# Patient Record
Sex: Male | Born: 1955 | Race: White | Hispanic: No | Marital: Married | Smoking: Former smoker
Health system: Southern US, Community
[De-identification: ages and names within clinical notes are randomized; demographics above are authoritative.]

## PROBLEM LIST (undated history)

## (undated) DIAGNOSIS — E119 Type 2 diabetes mellitus without complications: Secondary | ICD-10-CM

## (undated) DIAGNOSIS — N200 Calculus of kidney: Secondary | ICD-10-CM

## (undated) DIAGNOSIS — Z87442 Personal history of urinary calculi: Secondary | ICD-10-CM

## (undated) HISTORY — PX: LITHOTRIPSY: SUR834

---

## 2006-05-25 ENCOUNTER — Ambulatory Visit: Payer: Self-pay | Admitting: Emergency Medicine

## 2006-11-30 ENCOUNTER — Ambulatory Visit: Payer: Self-pay | Admitting: Internal Medicine

## 2007-07-11 ENCOUNTER — Ambulatory Visit: Payer: Self-pay | Admitting: Internal Medicine

## 2007-08-13 ENCOUNTER — Emergency Department: Payer: Self-pay | Admitting: Emergency Medicine

## 2008-01-04 ENCOUNTER — Ambulatory Visit: Payer: Self-pay | Admitting: Gastroenterology

## 2008-05-23 ENCOUNTER — Ambulatory Visit: Payer: Self-pay | Admitting: Internal Medicine

## 2008-05-23 ENCOUNTER — Emergency Department: Payer: Self-pay | Admitting: Emergency Medicine

## 2009-08-03 ENCOUNTER — Ambulatory Visit: Payer: Self-pay | Admitting: Family Medicine

## 2009-11-22 ENCOUNTER — Ambulatory Visit: Payer: Self-pay | Admitting: Internal Medicine

## 2011-01-27 ENCOUNTER — Emergency Department: Payer: Self-pay | Admitting: *Deleted

## 2011-01-27 LAB — RAPID INFLUENZA A&B ANTIGENS

## 2011-02-02 ENCOUNTER — Observation Stay: Payer: Self-pay | Admitting: Internal Medicine

## 2011-02-02 LAB — COMPREHENSIVE METABOLIC PANEL
Alkaline Phosphatase: 63 U/L (ref 50–136)
Anion Gap: 7 (ref 7–16)
BUN: 9 mg/dL (ref 7–18)
Bilirubin,Total: 0.5 mg/dL (ref 0.2–1.0)
Chloride: 106 mmol/L (ref 98–107)
Co2: 28 mmol/L (ref 21–32)
Creatinine: 0.83 mg/dL (ref 0.60–1.30)
EGFR (African American): >60
EGFR (Non-African Amer.): >60
Osmolality: 282 (ref 275–301)
Potassium: 4 mmol/L (ref 3.5–5.1)
SGPT (ALT): 41 U/L
Sodium: 141 mmol/L (ref 136–145)
Total Protein: 7.2 g/dL (ref 6.4–8.2)

## 2011-02-02 LAB — APTT: Activated PTT: 26.8 secs (ref 23.6–35.9)

## 2011-02-02 LAB — LIPID PANEL
Cholesterol: 169 mg/dL (ref 0–200)
Ldl Cholesterol, Calc: 67 mg/dL (ref 0–100)
Triglycerides: 343 mg/dL — ABNORMAL HIGH (ref 0–200)

## 2011-02-02 LAB — CBC
HCT: 45.4 % (ref 40.0–52.0)
MCH: 32 pg (ref 26.0–34.0)
MCHC: 34.2 g/dL (ref 32.0–36.0)
MCV: 94 fL (ref 80–100)
WBC: 7.8 10*3/uL (ref 3.8–10.6)

## 2011-02-02 LAB — TROPONIN I
Troponin-I: <0.02 ng/mL
Troponin-I: <0.02 ng/mL

## 2011-02-02 LAB — PROTIME-INR: Prothrombin Time: 12.4 secs (ref 11.5–14.7)

## 2011-02-03 LAB — CBC WITH DIFFERENTIAL/PLATELET
Basophil #: 0.1 10*3/uL (ref 0.0–0.1)
Eosinophil #: 0.2 10*3/uL (ref 0.0–0.7)
Eosinophil %: 3.5 %
HCT: 40.7 % (ref 40.0–52.0)
Lymphocyte #: 1.7 10*3/uL (ref 1.0–3.6)
MCH: 32.4 pg (ref 26.0–34.0)
MCV: 93 fL (ref 80–100)
Monocyte #: 0.6 10*3/uL (ref 0.0–0.7)
Neutrophil #: 4 10*3/uL (ref 1.4–6.5)
Neutrophil %: 60.6 %
Platelet: 138 10*3/uL — ABNORMAL LOW (ref 150–440)
RDW: 13.2 % (ref 11.5–14.5)
WBC: 6.6 10*3/uL (ref 3.8–10.6)

## 2011-02-03 LAB — BASIC METABOLIC PANEL
Anion Gap: 12 (ref 7–16)
BUN: 8 mg/dL (ref 7–18)
Calcium, Total: 8.4 mg/dL — ABNORMAL LOW (ref 8.5–10.1)
Chloride: 106 mmol/L (ref 98–107)
Co2: 26 mmol/L (ref 21–32)
EGFR (Non-African Amer.): >60
Potassium: 3.9 mmol/L (ref 3.5–5.1)
Sodium: 144 mmol/L (ref 136–145)

## 2011-02-03 LAB — TROPONIN I: Troponin-I: <0.02 ng/mL

## 2011-02-03 LAB — LIPID PANEL: HDL Cholesterol: 26 mg/dL — ABNORMAL LOW (ref 40–60)

## 2011-03-07 ENCOUNTER — Ambulatory Visit: Payer: Self-pay | Admitting: Family Medicine

## 2011-03-10 ENCOUNTER — Ambulatory Visit: Payer: Self-pay | Admitting: Family Medicine

## 2012-05-26 ENCOUNTER — Emergency Department: Payer: Self-pay | Admitting: Unknown Physician Specialty

## 2012-05-26 LAB — CBC
HCT: 41.4 % (ref 40.0–52.0)
HGB: 14.2 g/dL (ref 13.0–18.0)
MCH: 31 pg (ref 26.0–34.0)
MCV: 90 fL (ref 80–100)
Platelet: 186 10*3/uL (ref 150–440)
RBC: 4.59 10*6/uL (ref 4.40–5.90)
RDW: 13.3 % (ref 11.5–14.5)
WBC: 11.8 10*3/uL — ABNORMAL HIGH (ref 3.8–10.6)

## 2012-05-26 LAB — URINALYSIS, COMPLETE
Bacteria: NONE SEEN
Bilirubin,UR: NEGATIVE
Leukocyte Esterase: NEGATIVE
Nitrite: NEGATIVE
Ph: 6 (ref 4.5–8.0)
Protein: NEGATIVE
RBC,UR: 10 /HPF (ref 0–5)
Squamous Epithelial: <1
WBC UR: 2 /HPF (ref 0–5)

## 2012-05-26 LAB — BASIC METABOLIC PANEL
Anion Gap: 8 (ref 7–16)
Chloride: 102 mmol/L (ref 98–107)
Co2: 25 mmol/L (ref 21–32)
Glucose: 101 mg/dL — ABNORMAL HIGH (ref 65–99)
Osmolality: 271 (ref 275–301)
Potassium: 3.6 mmol/L (ref 3.5–5.1)

## 2013-02-06 IMAGING — CR DG CHEST 2V
1 series · 2 of 2 positions shown · non-contrast
Comparison: none

REASON FOR EXAM: cough for 3 weeks
COMMENTS:   May transport without cardiac monitor

PROCEDURE:     DXR - DXR CHEST PA (OR AP) AND LATERAL  - January 27, 2011  [DATE]
RESULT:     Comparison is made to the prior exam of 11/22/2009. The lung
fields are clear. The heart, mediastinal and osseous structures show no
significant abnormalities.

[Series 1: w chest pa · 0.14mm/px · 2 of 2 slices shown]
[im 1/2]
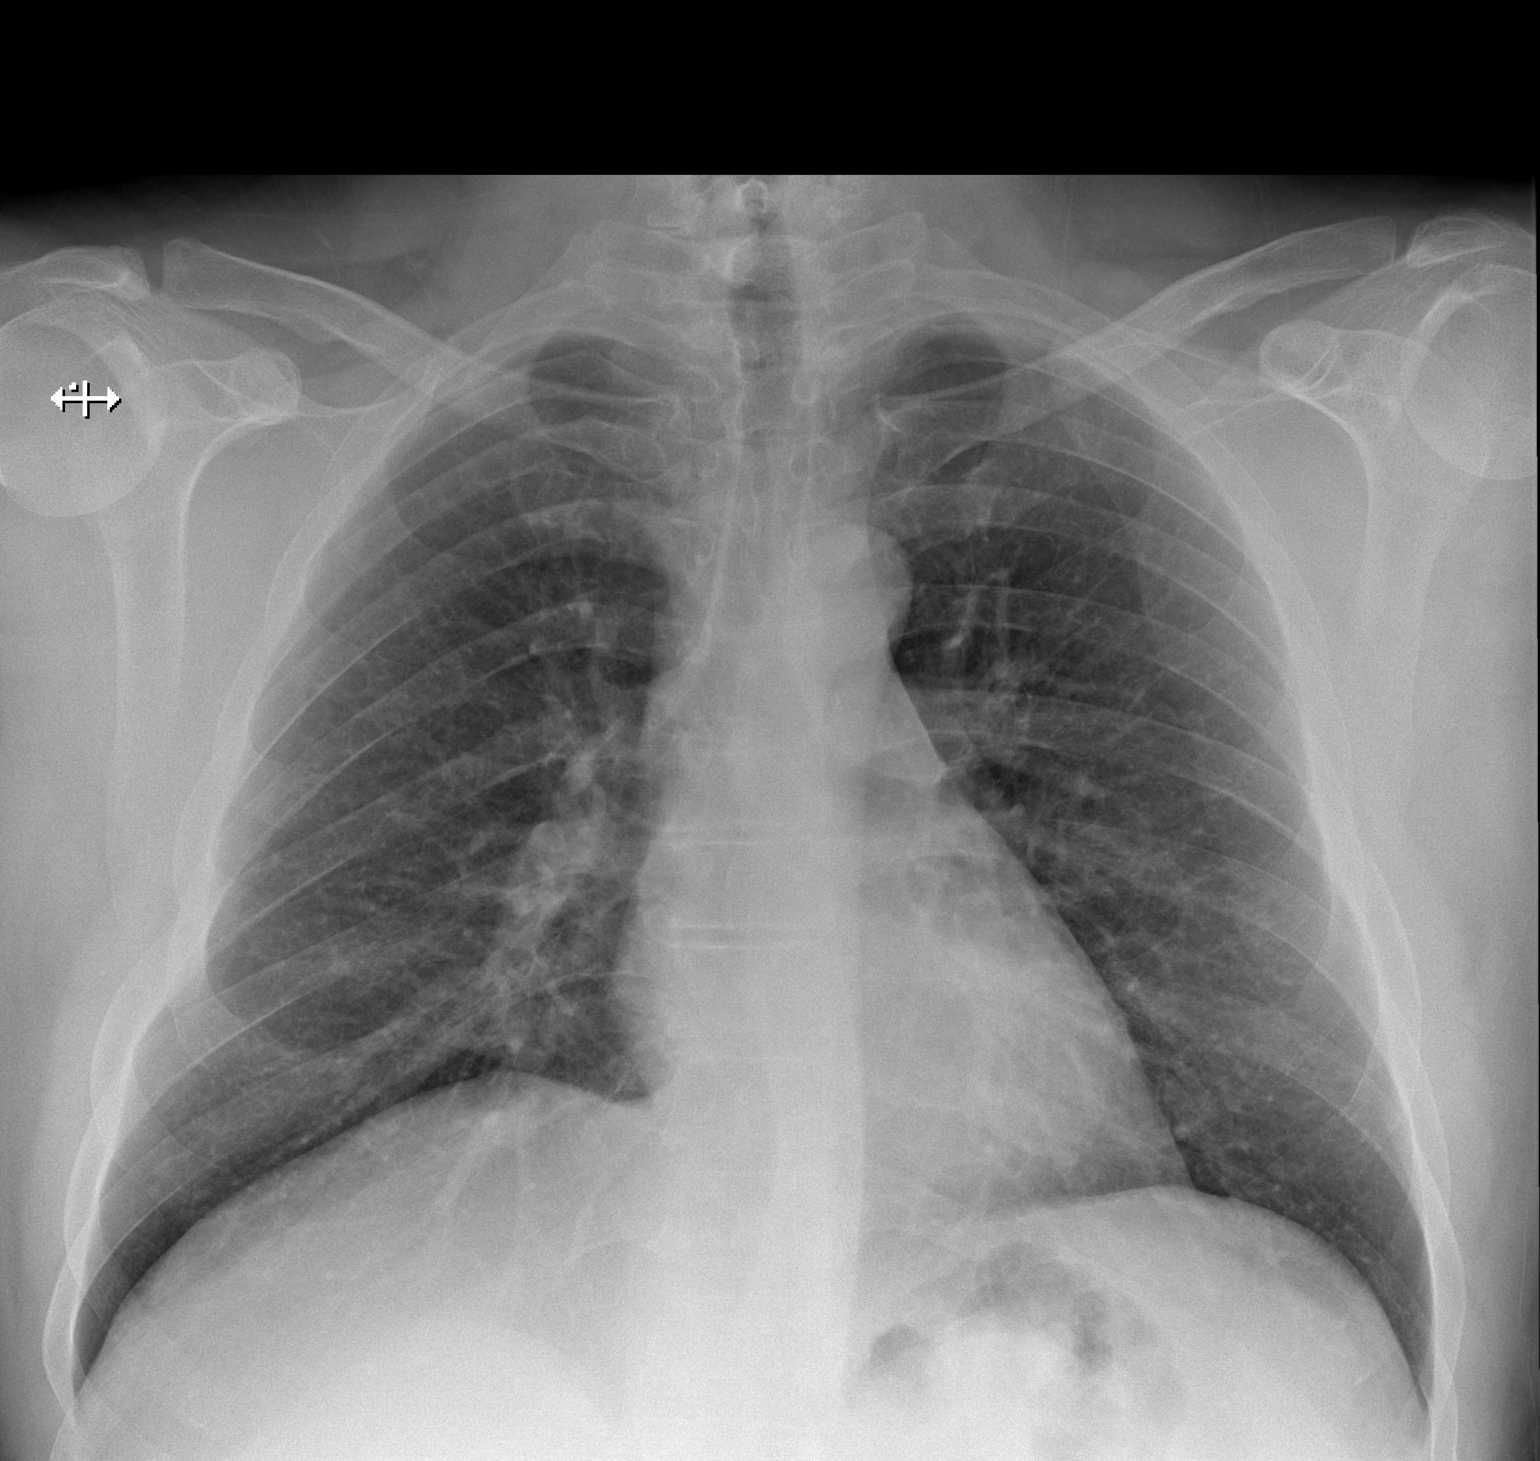
[im 2/2]
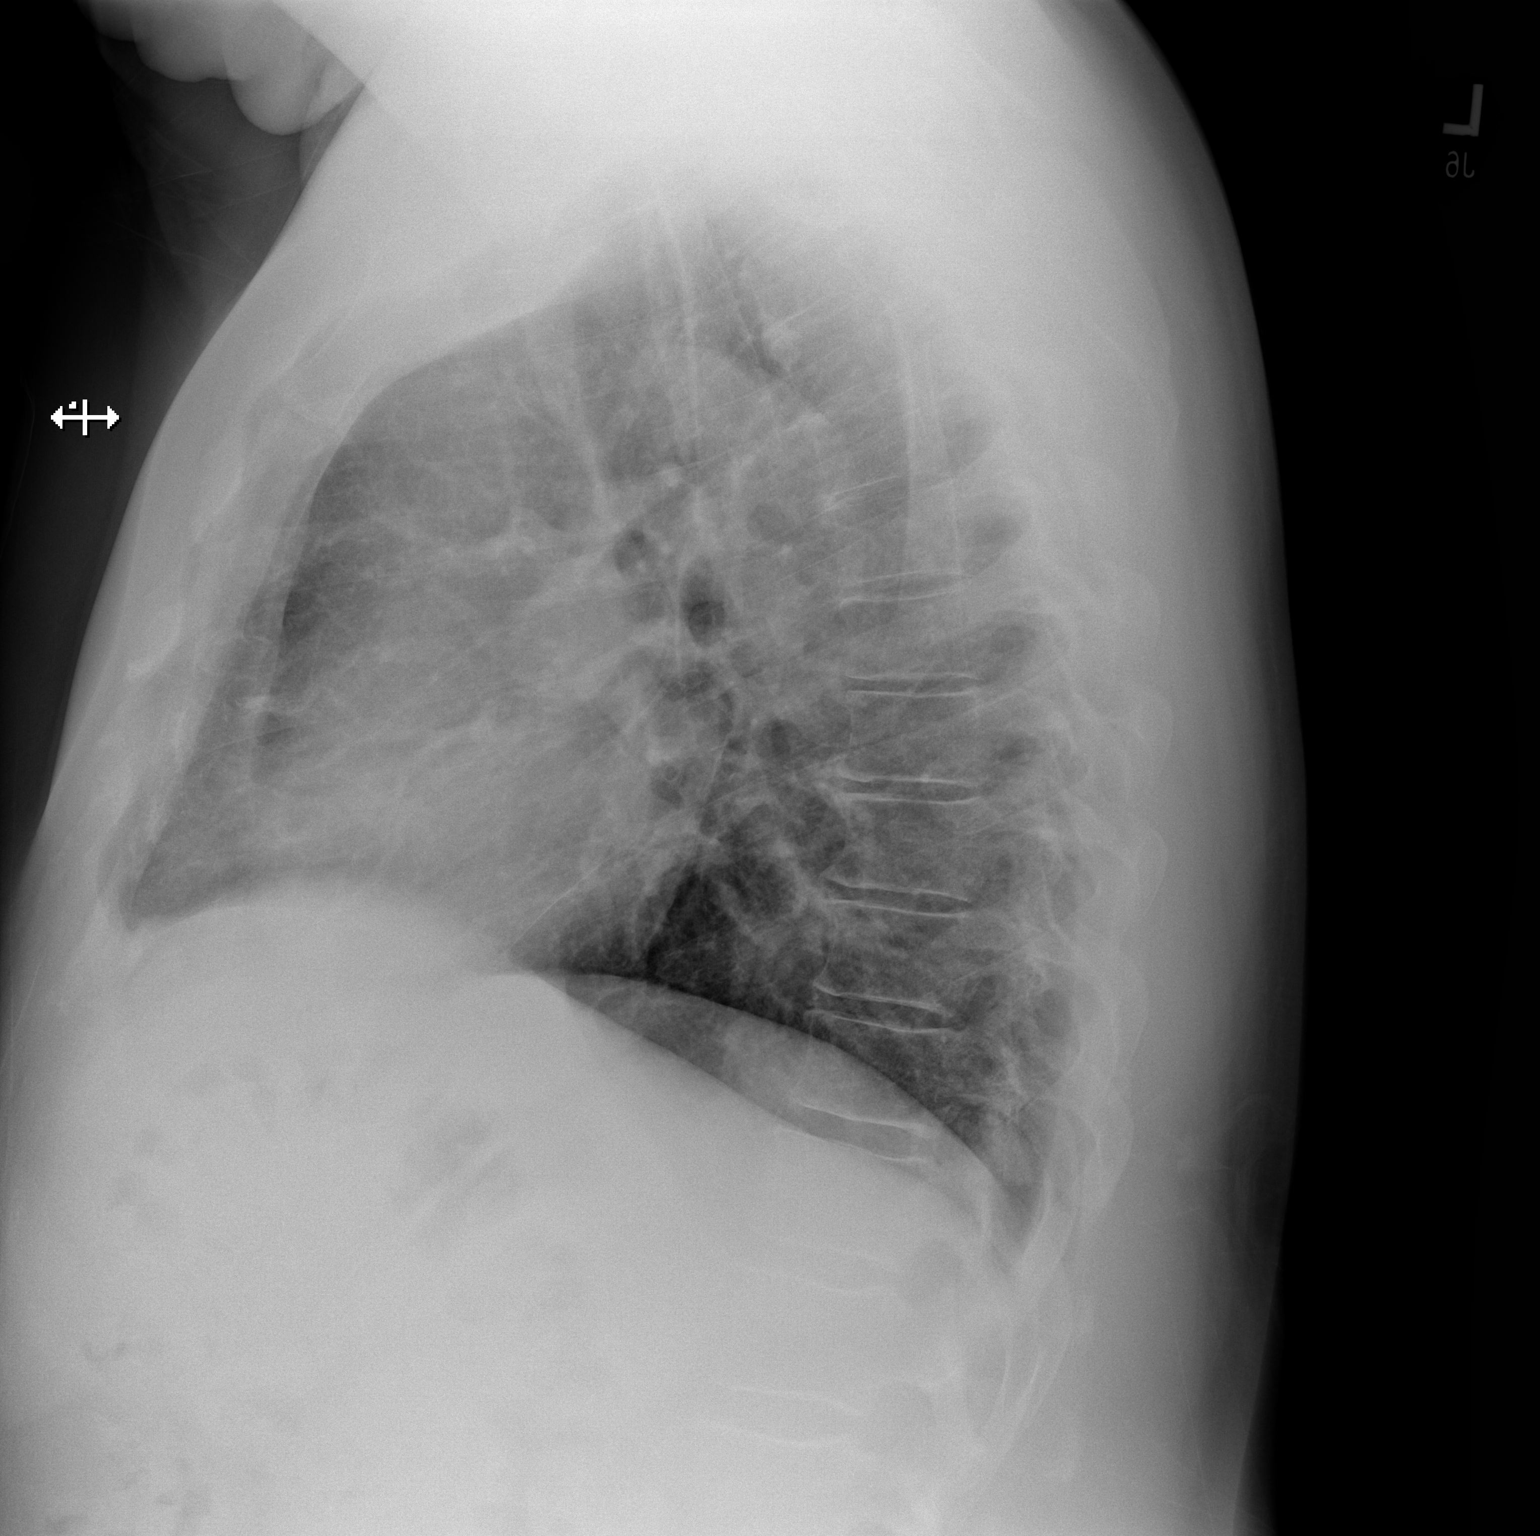

[2 of 2 positions shown; findings below may reference images not displayed]

IMPRESSION: No acute changes are identified.

## 2013-02-12 IMAGING — US ABDOMEN ULTRASOUND
1 series · 17 of 25 positions shown · non-contrast
Comparison: none

REASON FOR EXAM: r/o gall stone, shoulder blade pain, n/v/abd pain
COMMENTS:

PROCEDURE:     US  - US ABDOMEN GENERAL SURVEY  - February 02, 2011  [DATE]
RESULT:     Comparison: None
TECHNIQUE: Multiple gray-scale and color-flow Doppler images of the abdomen
are presented for review.

[Series 1: abdomen ultrasound · 17 of 93 slices shown]
[im 1/93]
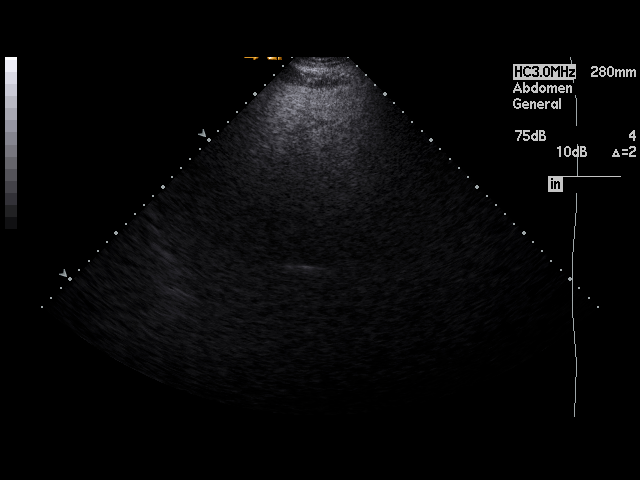
[im 8/93]
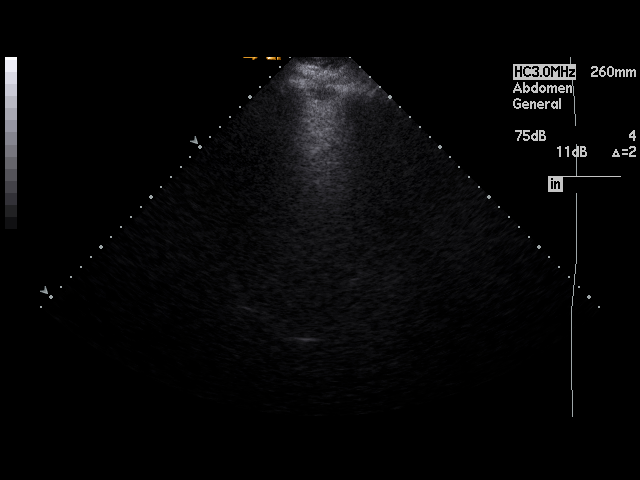
[im 12/93]
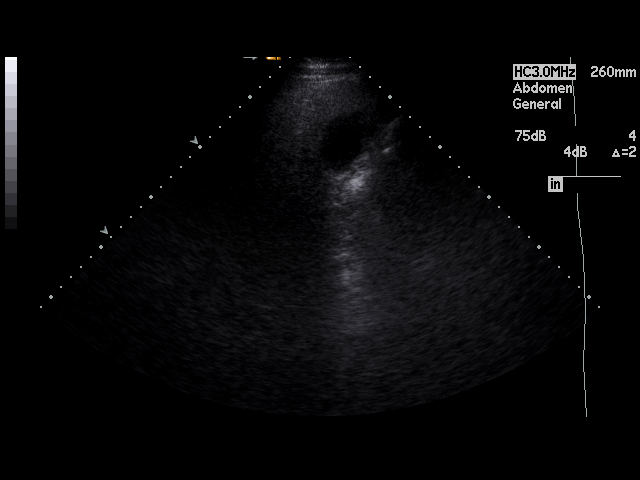
[im 20/93]
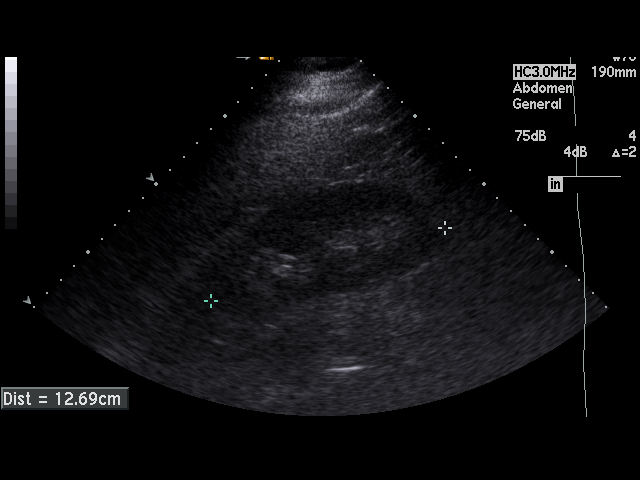
[im 24/93]
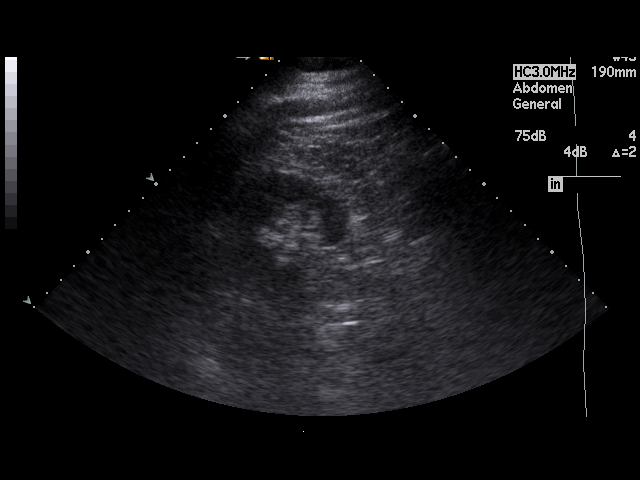
[im 31/93]
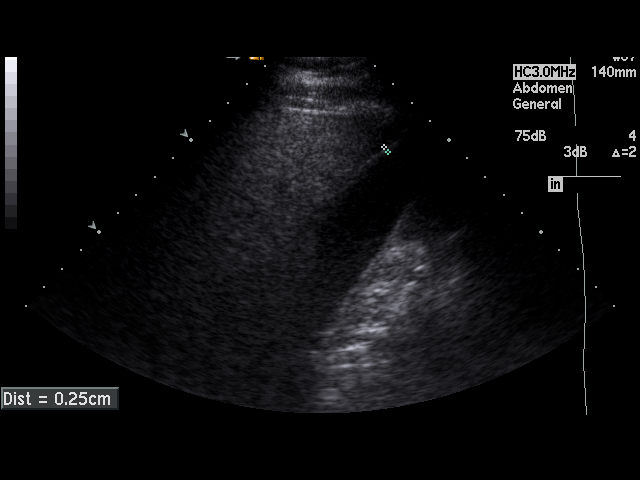
[im 35/93]
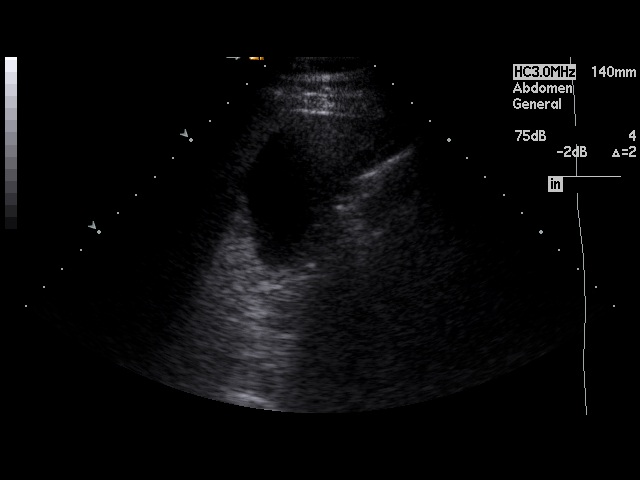
[im 43/93]
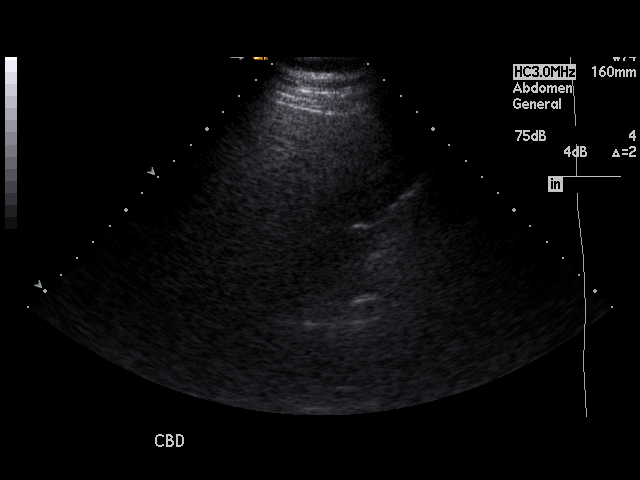
[im 47/93]
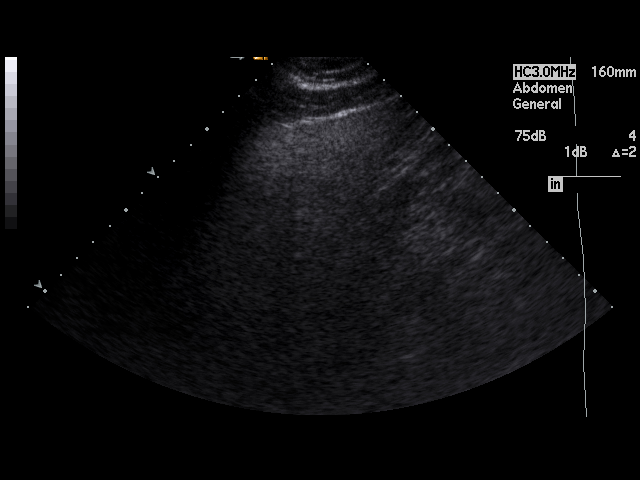
[im 50/93]
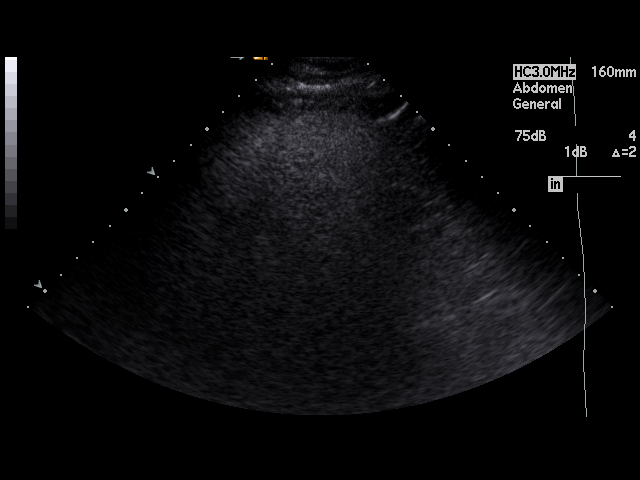
[im 58/93]
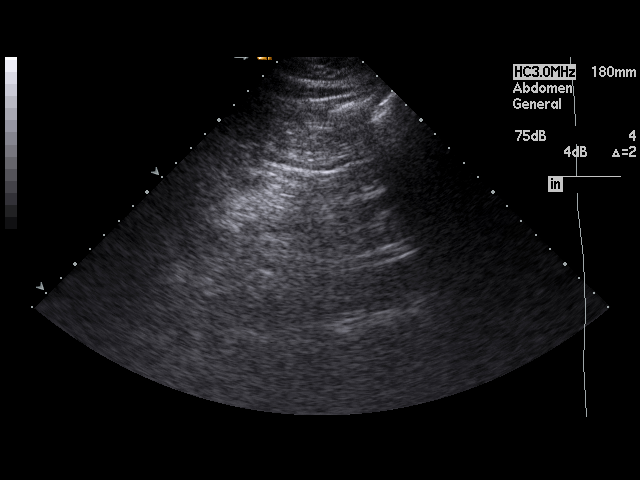
[im 62/93]
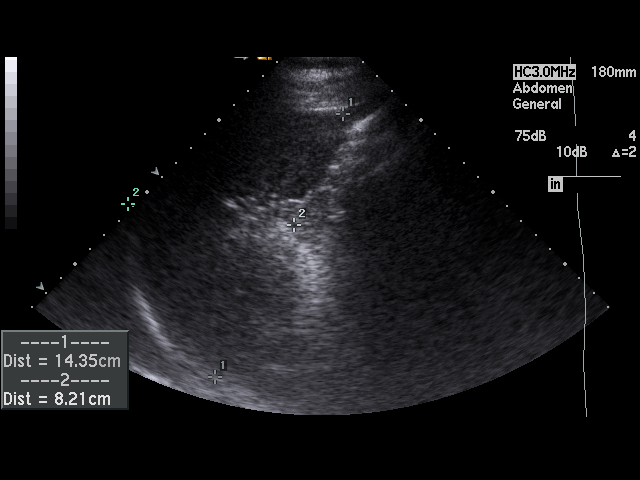
[im 70/93]
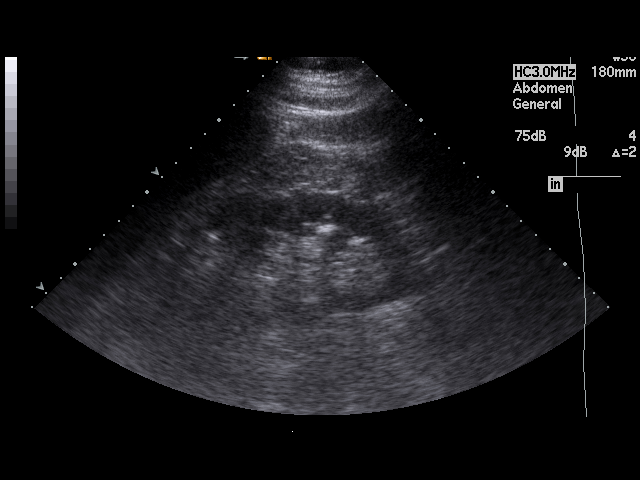
[im 73/93]
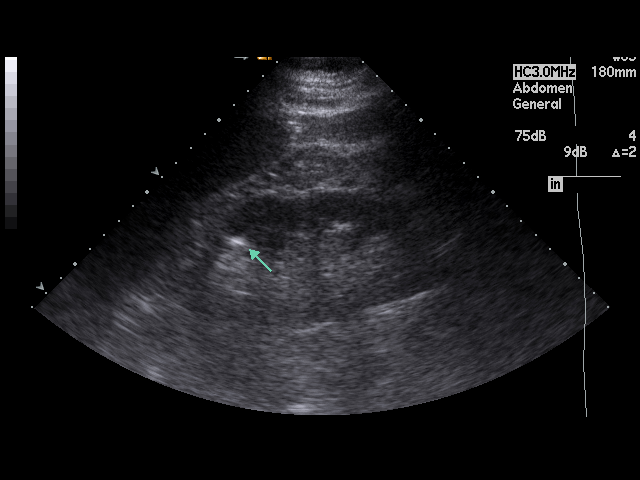
[im 81/93]
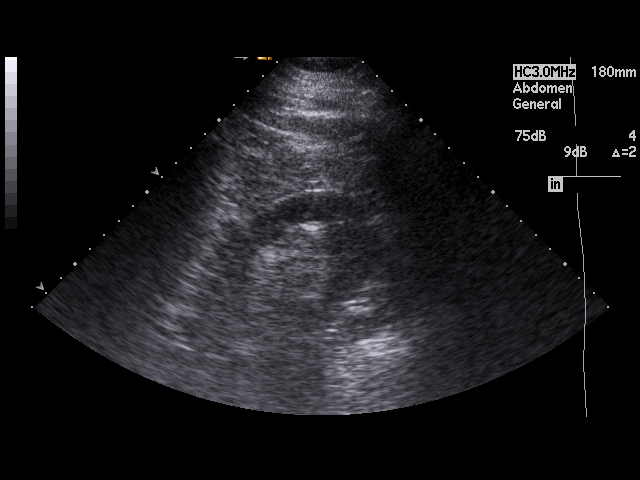
[im 85/93]
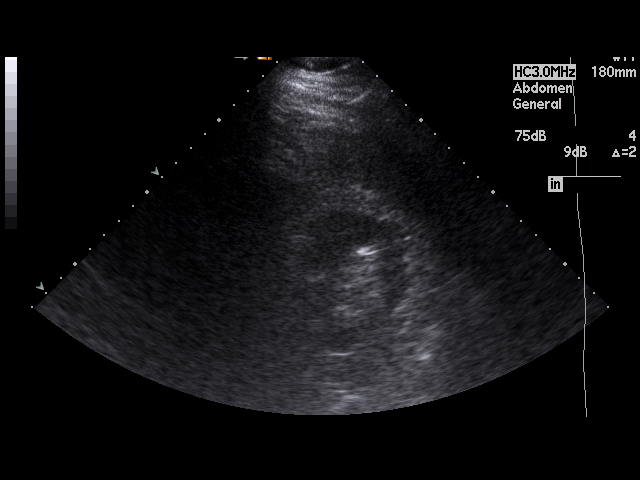
[im 93/93]
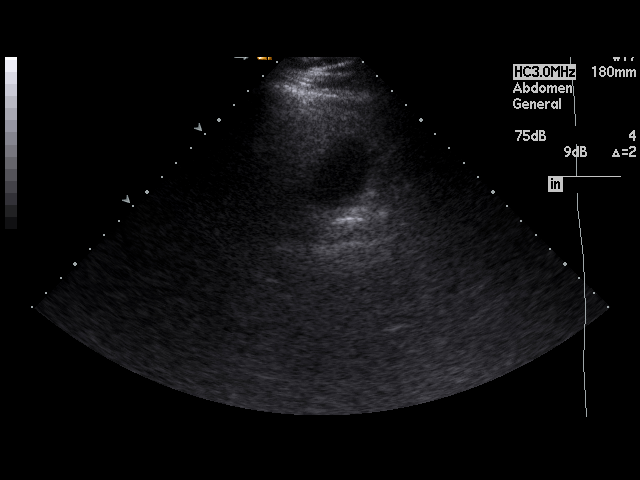

[17 of 25 positions shown; findings below may reference images not displayed]

FINDINGS: The liver is increased in echogenicity most consistent with hepatic
steatosis. There is no focal mass.

The gallbladder is suboptimally visualized. There are no definite
cholelithiasis. There is no intra- or extrahepatic biliary ductal
dilatation. The common duct measures 5.4 mm in maximal diameter. There is no
gallbladder wall thickening, pericholecystic fluid, or sonographic Murphy's
sign.

The visualized portion of the pancreas is normal in echogenicity. The spleen
is enlarged measuring 14.4 cm.. Bilateral kidneys are normal in echogenicity
and size. The right kidney measures 12.7 x 6.2 x 5.9 cm. The left kidney
measures 13.5 x 6.4 x 6.5 cm. There is no hydronephrosis. There is an 8mm
nonobstructing left renal calculus. The abdominal aorta  is unremarkable.
The IVC is suboptimally visualized.
IMPRESSION: 1. The gallbladder is suboptimally visualized secondary to an echogenic
liver making it technically difficult to visualize the gallbladder. There
are no definite cholelithiasis or sonographic evidence of acute
cholecystitis. If there is further clinical concern recommend a HIDA scan.
2. Splenomegaly.
3. Hepatic steatosis.
4. Nonobstructing left nephrolithiasis.

## 2013-10-31 ENCOUNTER — Ambulatory Visit: Payer: Self-pay | Admitting: Emergency Medicine

## 2014-05-13 NOTE — H&P (Signed)
PATIENT NAME:  Steven Clements, Steven Clements MR#:  [id number] DATE OF BIRTH:  12-Jun-1955  DATE OF ADMISSION:  02/02/2011  PRIMARY CARE PHYSICIAN:  Dione HousekeeperMario Ernesto Olmedo, MD at [facility]   REFERRING PHYSICIAN:  Daryel NovemberJonathan Williams, MD    CHIEF COMPLAINT: Chest pain.   HISTORY OF PRESENT ILLNESS: The patient is a 59 year old male with no significant medical problems, is being admitted for suspected unstable angina. The patient was recently treated about a week ago for acute bronchitis with antibiotics. He was negative for influenza. He was doing okay until this morning when he went to work and started feeling really diaphoretic. He could barely walk 350 feet and all the symptoms started, including left-sided chest pain radiating across his chest and going back between the shoulder blades. He was very diaphoretic, pale and vomited twice. He was feeling really dizzy and decided to come to the Emergency Department. While in the Emergency Department he was given nitroglycerin and aspirin, and his chest pain has resolved; but he is not feeling too well, and he is being admitted for further evaluation and management.   PAST MEDICAL HISTORY: None.   ALLERGIES: No known drug allergies.  SOCIAL HISTORY: He smokes two cigarettes daily for the last 25 years. His wife is an active smoker, and he has been exposed to passive smoking quite more. He quit on-and-off about a total of 11 years. He denies any alcohol. He has worked at [employer] for the last eight months, and it has been a very stressful job as he recently had a new Associate Professorunit manager.   FAMILY HISTORY: His father had myocardial infarction in 5370s, was also diagnosed with cancer.   MEDICATIONS AT HOME:  1. Promethazine DM 5 mL p.o. every 6 hours as needed.  2. Clarithromycin 500 mg p.o. b.i.d. for a total of 10 days, prescribed on last Emergency Room visit on 01/27/2011.  3. Benzonatate 100 mg oral capsule p.o. Clements.i.d. as needed.  4. Advil Liquigel  as needed.  REVIEW OF SYSTEMS: CONSTITUTIONAL: No fever. Positive for fatigue and weakness. EYES: No blurred or double vision. ENT: No tinnitus or ear pain. RESPIRATORY: No cough, wheezing, hemoptysis. CARDIOVASCULAR: Positive for chest pain, diaphoresis. GASTROINTESTINAL: Positive for nausea and vomiting x2. GENITOURINARY: No dysuria or hematuria. ENDOCRINE: No polyuria or nocturia. HEMATOLOGY: No anemia or easy bruising. SKIN: No rash or lesion. MUSCULOSKELETAL: No arthritis or muscle cramps. NEUROLOGICAL: No tingling, numbness, weakness. Feeling dizzy. PSYCHIATRIC: History of anxiety, depression.   PHYSICAL EXAMINATION:  VITAL SIGNS: Temperature 97.8, heart rate 91 per minute, respirations 20 per minute, blood pressure 118/72 mmHg. He is saturating 97% on room air.   GENERAL: The patient is a 59 year old male lying in the bed comfortably without any acute distress.   HEENT: Eyes: Pupils are equal, round, reactive to light and accommodation. No scleral icterus. Extraocular muscles are intact.  HENT: Head normocephalic, atraumatic. Oropharynx and nasopharynx are clear.   NECK: Supple, no jugular venous distention, no thyroid enlargement or tenderness.   LUNGS: Clear to auscultation bilaterally. No wheezing, rales, rhonchi, or crepitations.   HEART: S1, S2 normal. No murmur, rubs, or gallops.   ABDOMEN: Soft, nontender, nondistended. Bowel sounds are present. No organomegaly or mass.   EXTREMITIES: No pedal edema, cyanosis, or clubbing.   NEUROLOGICAL: Nonfocal examination. Cranial nerves III through XII are intact. Muscle strength five out of five. Extremity sensation intact.   PSYCHIATRIC: The patient is oriented to time, place, and person x3.   SKIN: No  obvious rash, lesion, or ulcer.   LABORATORY, DIAGNOSTIC AND RADIOLOGICAL DATA:  Normal CBC.  Normal BMP.  Normal liver function tests.  Normal first set of cardiac enzymes.  Normal coagulation panel.  Negative influenza test on  01/27/2011.  Chest x-ray shows no acute cardiopulmonary disease.  EKG shows normal sinus rhythm, no major ST-Clements changes.   IMPRESSION AND PLAN:  1. Suspected unstable angina: We will do serial cardiac enzymes, start him on aspirin, nitroglycerin, beta blocker. Obtain Myoview in the morning. We will check fasting lipid profile.  2. Nausea and vomiting along with pain in the shoulder blade: Could be possible underlying gallstone. We will get abdominal ultrasound.  3. Tobacco abuse: The patient was counseled for about three minutes. He is trying to quit on his own. He has already quit on and off for about 11 years. He does not think he needs any nicotine replacement therapy while in the hospital.   TIME TAKEN: Total time taking care of this patient was 45 minutes.  ____________________________ Ellamae Sia. Sherryll Burger, MD vss:cbb D: 02/02/2011 14:38:02 ET Clements: 02/02/2011 15:31:55 ET JOB#: [id number]  cc: Suhey Radford S. Sherryll Burger, MD, <Dictator> Dione Housekeeper, MD Ellamae Sia [facility] MD ELECTRONICALLY SIGNED 02/04/2011 10:38

## 2014-05-13 NOTE — Discharge Summary (Signed)
PATIENT NAME:  Steven Clements, Steven Clements MR#:  [id number] DATE OF BIRTH:  10/15/1955  DATE OF ADMISSION:  02/02/2011 DATE OF DISCHARGE:  02/03/2011  PRIMARY CARE PHYSICIAN: Dr. Zada Finderslmedo   CARDIOLOGIST: Dr. Adrian BlackwaterShaukat Khan   DISCHARGE DIAGNOSES:  1. Chest pain, likely noncardiac, possibly due to underlying stress.  2. Nausea and vomiting, now resolved, transient in nature. Could be viral in nature.   SECONDARY DIAGNOSES: None.   CONSULTATIONS: None.   PROCEDURES/RADIOLOGY:  1. Chest x-ray on 01/14 showed no acute cardiopulmonary disease.  2. Chest x-ray on 01/15 showed no acute cardiopulmonary disease.  3. Stress test on 01/15 by Dr. Adrian BlackwaterShaukat Khan showed normal LV systolic function, negative stress test.  4. Abdominal ultrasound 01/14 showed no definite cholelithiasis. No acute cholecystitis. Splenomegaly, hepatic steatosis. Nonobstructing left-sided nephrolithiasis.   HISTORY AND SHORT HOSPITAL COURSE: The patient is a 59 year old male with no significant medical problems who was admitted for chest pain and was ruled out with 3 negative sets of cardiac enzymes.  He underwent Myoview, which was negative. He also had some nausea and vomiting, which was transient and resolved.  He underwent abdominal ultrasound to rule out any gallstones and it was negative.  He also had a negative influenza test.  He had hyperlipidemia for which he was instructed dietary changes. He did not have any further chest pain and was doing much better on 02/03/2011 and was discharged home in stable condition.   VITAL SIGNS: On the date of discharge, his vital signs were as follows: Temperature 98.4, heart rate 69 per minute, respirations 20 per minute, blood pressure 105/64 mmHg.  He was saturating 96% on room air.   PERTINENT PHYSICAL EXAMINATION: CARDIOVASCULAR: S1, S2 normal. No murmur, rubs, or gallop. LUNGS: Clear to auscultation bilaterally. No wheezes, rales, rhonchi, or crepitation. ABDOMEN: Soft, benign, obese.  NEUROLOGIC: Nonfocal examination. All other physical examination remained at baseline.   DISCHARGE MEDICATIONS:  1. Ibuprofen 800 mg p.o. 3 times a day as needed.  2. Tramadol 50 mg p.o. every 4 hours as needed.  3. Promethazine DM 5 mL p.o. every six hours.  4. Benzonatate 100 mg p.o. 3 times daily.  5. Clarithromycin 500 mg p.o. b.i.d. for 10 days, to finish on  02/07/2011 as prescribed by his previous physician.   DISCHARGE DIET: Low sodium, low cholesterol.   DISCHARGE ACTIVITY: As tolerated.   DISCHARGE INSTRUCTIONS AND FOLLOWUP:  1. The patient was instructed to follow up with his primary care physician, [facility] in 1 to 2 weeks.  2. He will need followup with Dr. Adrian BlackwaterShaukat Khan on 01/17 at 10:00 a.m. as scheduled.  3. He will need cholesterol medicine if there is not significant improvement in his total cholesterol. His cholesterol on the date of discharge was 148, triglyceride 422, HDL  26.   TOTAL TIME TAKING CARE OF THIS PATIENT: 45 minutes.    ____________________________ Ellamae SiaVipul S. Sherryll BurgerShah, MD vss:bjt D: 02/03/2011 15:29:48 ET Clements: 02/04/2011 10:21:51 ET JOB#: [id number]  cc: Momoka Stringfield S. Sherryll BurgerShah, MD, <Dictator> Dione HousekeeperMario Ernesto Olmedo, MD Laurier NancyShaukat A. Khan, MD Ellamae SiaVIPUL S [facility] MD ELECTRONICALLY SIGNED 02/05/2011 10:46

## 2014-06-06 IMAGING — CT CT ABD-PELV W/O CM
1 of 2 series · 15 of 32 positions shown, 19 images · non-contrast
Comparison: 03/10/2011

REASON FOR EXAM: (1) left flank pain; (2) left flank pain
COMMENTS:

PROCEDURE:     CT  - CT ABDOMEN AND PELVIS W[DATE]  [DATE]
RESULT:     Indication: Flank Pain
TECHNIQUE: Multiple axial images from the lung bases to the symphysis pubis
were obtained without oral and without intravenous contrast.

[Series 2: 3mm soft tissue · axial · 0.87mm/px · z∈[+253,+718]mm · 15 of 171 slices shown, 19 images]
[im 8/171  soft-tissue]
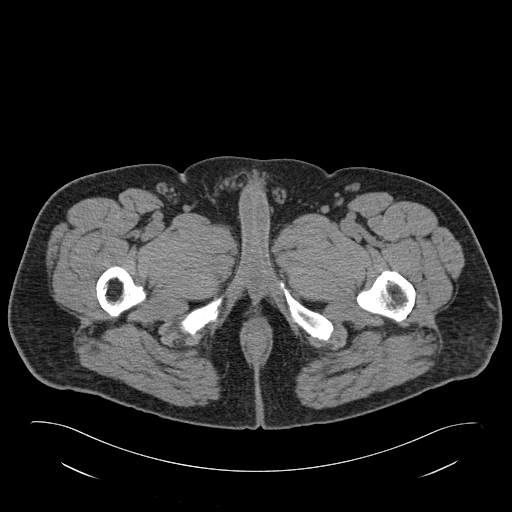
[im 8/171  bone]
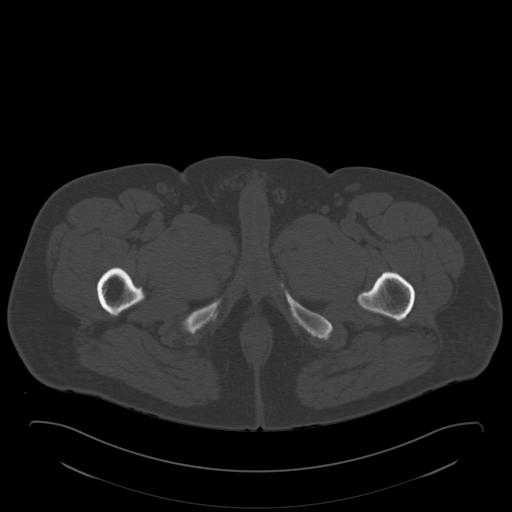
[im 22/171  soft-tissue]
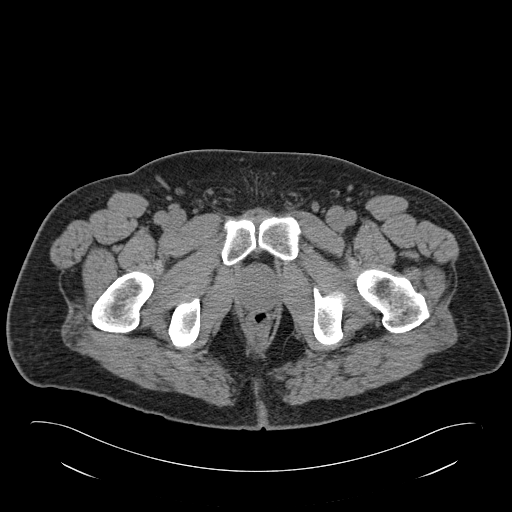
[im 36/171  soft-tissue]
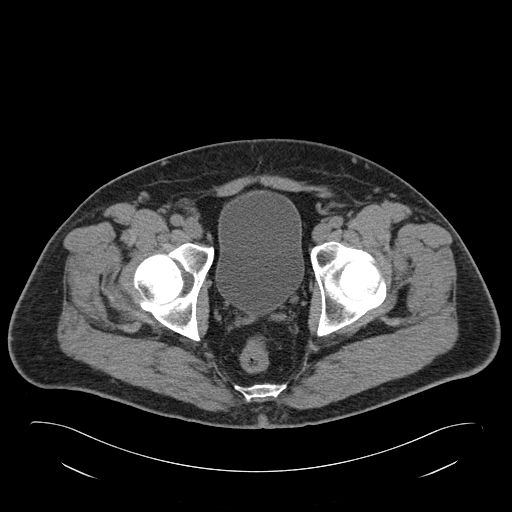
[im 50/171  soft-tissue]
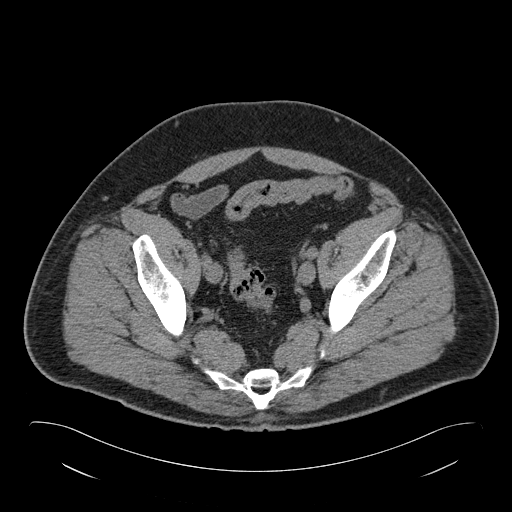
[im 57/171  soft-tissue]
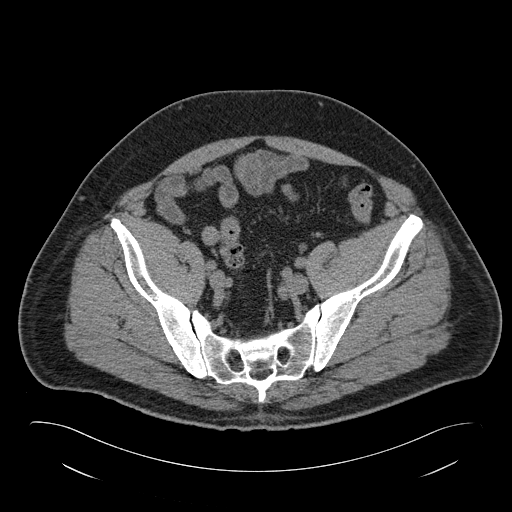
[im 71/171  soft-tissue]
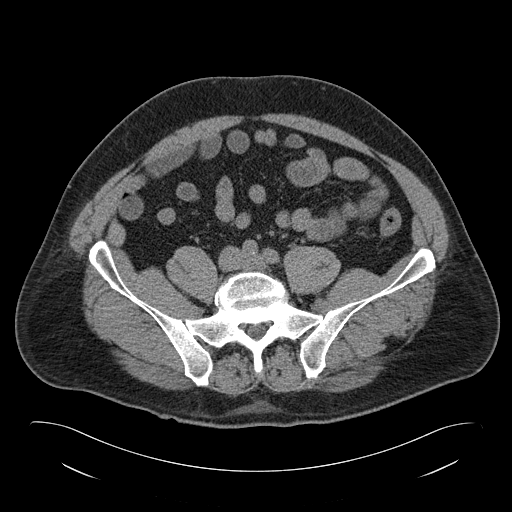
[im 86/171  soft-tissue]
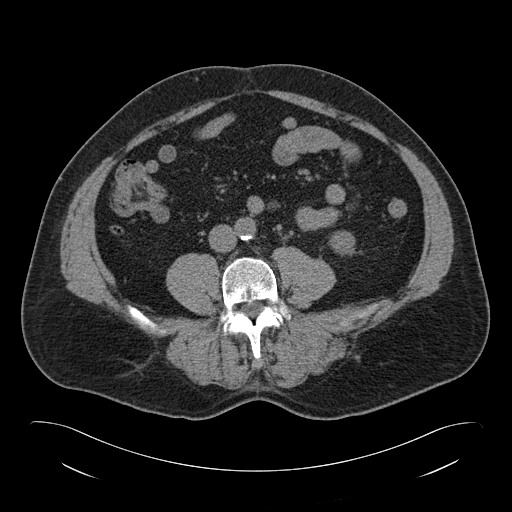
[im 100/171  soft-tissue]
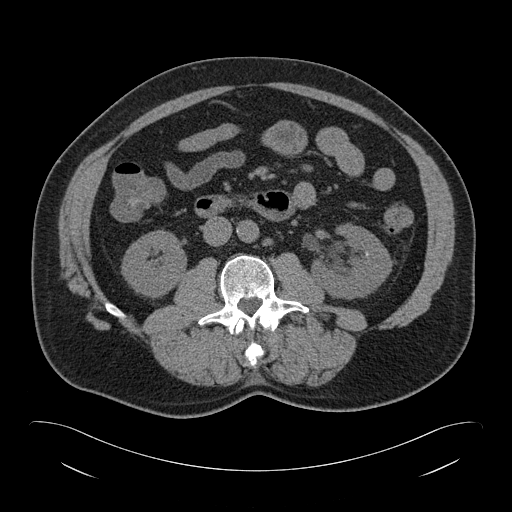
[im 114/171  soft-tissue]
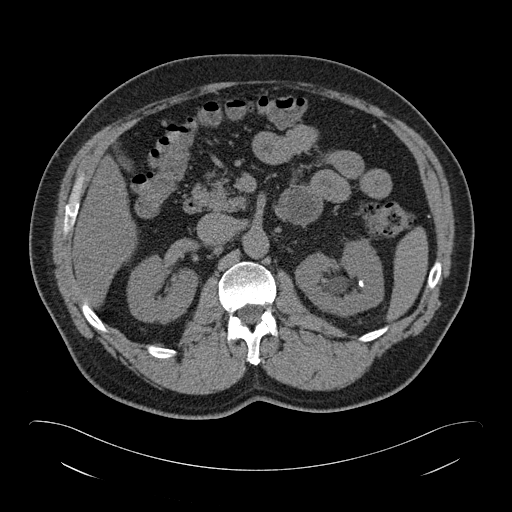
[im 114/171  bone]
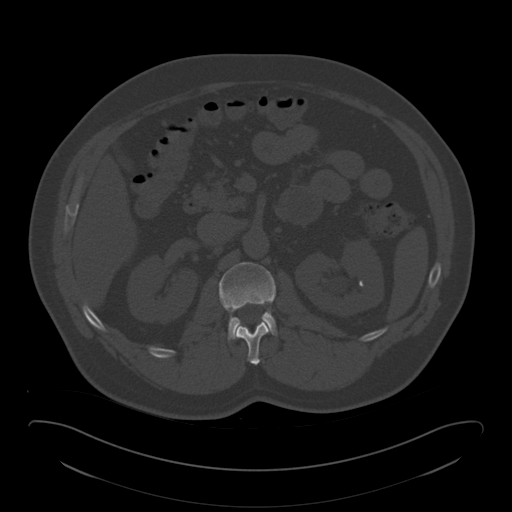
[im 121/171  soft-tissue]
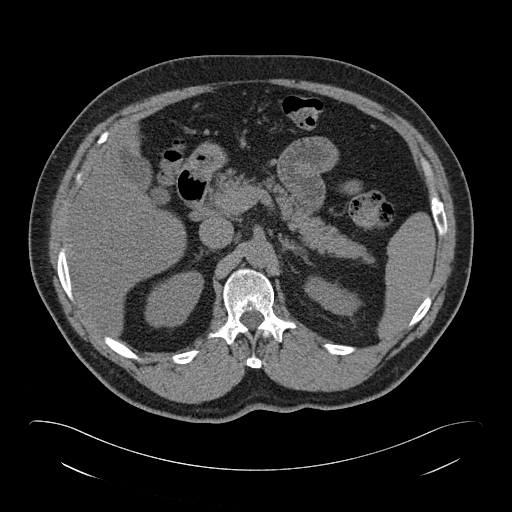
[im 135/171  soft-tissue]
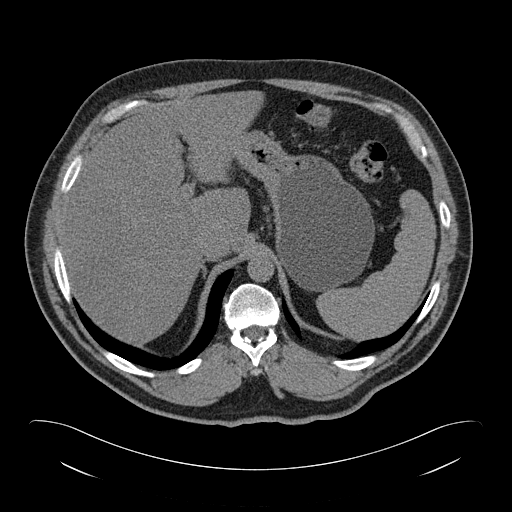
[im 142/171  lung]
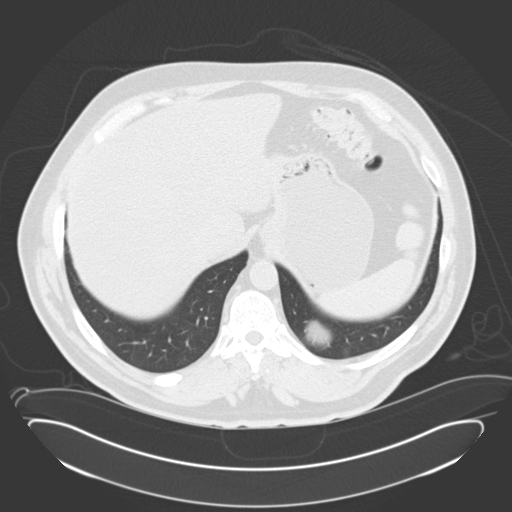
[im 149/171  soft-tissue]
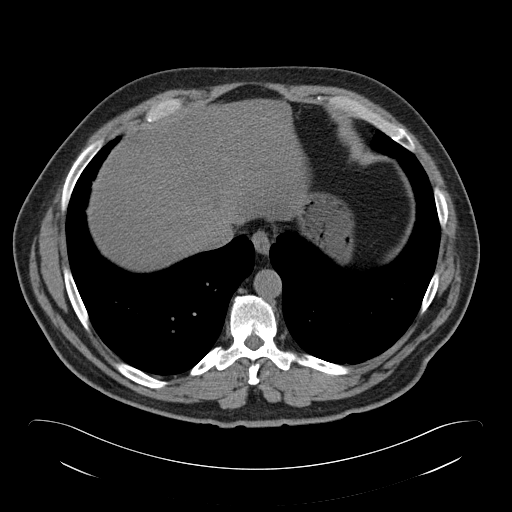
[im 149/171  lung]
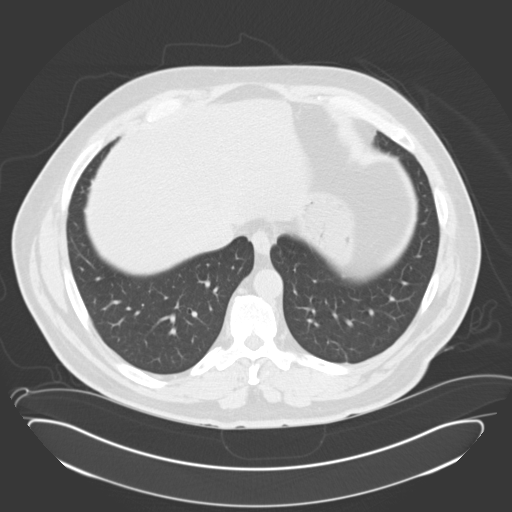
[im 156/171  lung]
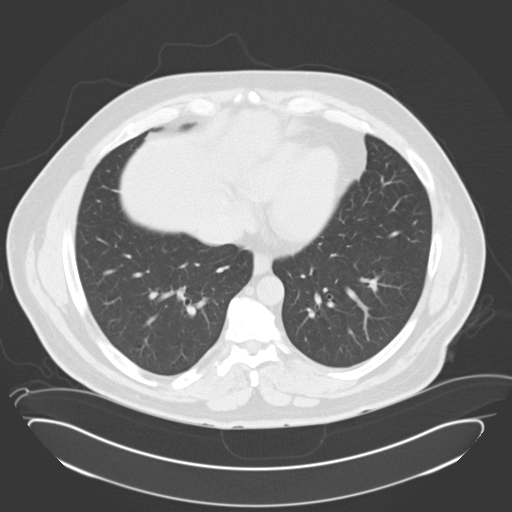
[im 163/171  soft-tissue]
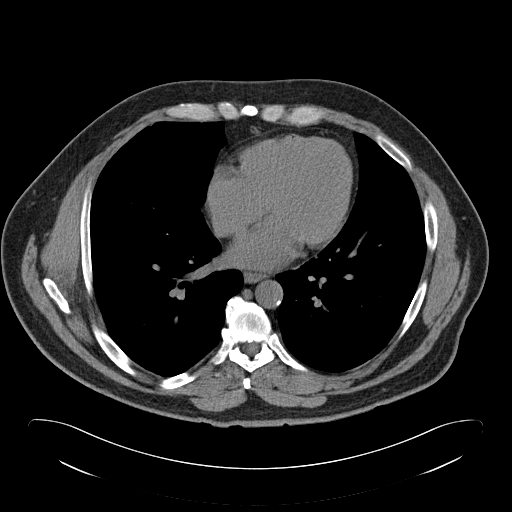
[im 163/171  lung]
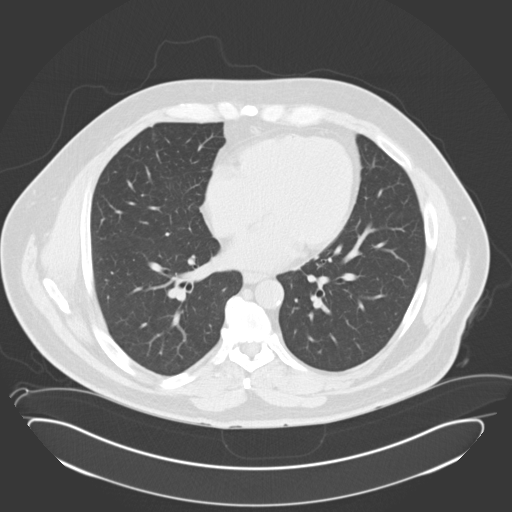

[15 of 32 positions shown; findings below may reference images not displayed]

FINDINGS: The lung bases are clear. There is no pleural or pericardial effusions.

There are left nephrolithiasis. There is a 6 mm proximal left ureteral
calculus resulting in mild left hydronephrosis. The kidneys are symmetric in
size without evidence for exophytic mass. The bladder is unremarkable.

The liver is diffusely low in attenuation likely secondary to hepatic
steatosis. The gallbladder is unremarkable. The spleen demonstrates no focal
abnormality. The adrenal glands and pancreas are normal.

The unopacified stomach, duodenum, small intestine, and large intestine are
unremarkable, but evaluation is limited by lack of oral contrast. There is a
normal caliber appendix in the right lower quadrant without periappendiceal
inflammatory changes. There is no pneumoperitoneum, pneumatosis, or portal
venous gas. There is no abdominal or pelvic free fluid. There is no
lymphadenopathy.

The abdominal aorta is normal in caliber with atherosclerosis.

The osseous structures are unremarkable.
IMPRESSION: 1. There is a 6 mm proximal left ureteral calculus resulting in mild left
hydronephrosis.

2. Left nephrolithiasis.

3. Hepatic steatosis.

[REDACTED]

## 2014-07-19 ENCOUNTER — Encounter: Payer: Self-pay | Admitting: Emergency Medicine

## 2014-07-19 ENCOUNTER — Ambulatory Visit
Admission: EM | Admit: 2014-07-19 | Discharge: 2014-07-19 | Disposition: A | Discharge status: Home / Self Care | Payer: Managed Care, Other (non HMO) | Attending: Emergency Medicine | Admitting: Emergency Medicine

## 2014-07-19 DIAGNOSIS — R112 Nausea with vomiting, unspecified: Secondary | ICD-10-CM | POA: Diagnosis present

## 2014-07-19 DIAGNOSIS — Z87891 Personal history of nicotine dependence: Secondary | ICD-10-CM | POA: Diagnosis not present

## 2014-07-19 DIAGNOSIS — N200 Calculus of kidney: Secondary | ICD-10-CM | POA: Insufficient documentation

## 2014-07-19 HISTORY — DX: Calculus of kidney: N20.0

## 2014-07-19 LAB — BASIC METABOLIC PANEL
Anion gap: 11 (ref 5–15)
BUN: 15 mg/dL (ref 6–20)
CALCIUM: 9.3 mg/dL (ref 8.9–10.3)
CHLORIDE: 99 mmol/L — AB (ref 101–111)
CO2: 25 mmol/L (ref 22–32)
Creatinine, Ser: 1.03 mg/dL (ref 0.61–1.24)
GFR calc Af Amer: >60 mL/min (ref >60)
GFR calc non Af Amer: >60 mL/min (ref >60)
Glucose, Bld: 128 mg/dL — ABNORMAL HIGH (ref 65–99)
POTASSIUM: 3.9 mmol/L (ref 3.5–5.1)
SODIUM: 135 mmol/L (ref 135–145)

## 2014-07-19 LAB — CBC WITH DIFFERENTIAL/PLATELET
BASOS PCT: 1 %
Basophils Absolute: 0.1 10*3/uL (ref 0–0.1)
Eosinophils Absolute: 0.1 10*3/uL (ref 0–0.7)
Eosinophils Relative: 1 %
HCT: 46.8 % (ref 40.0–52.0)
HEMOGLOBIN: 16.1 g/dL (ref 13.0–18.0)
LYMPHS ABS: 1.1 10*3/uL (ref 1.0–3.6)
Lymphocytes Relative: 10 %
MCH: 31.5 pg (ref 26.0–34.0)
MCHC: 34.5 g/dL (ref 32.0–36.0)
MCV: 91.4 fL (ref 80.0–100.0)
MONOS PCT: 6 %
Monocytes Absolute: 0.6 10*3/uL (ref 0.2–1.0)
NEUTROS ABS: 8.4 10*3/uL — AB (ref 1.4–6.5)
NEUTROS PCT: 82 %
PLATELETS: 154 10*3/uL (ref 150–440)
RBC: 5.12 MIL/uL (ref 4.40–5.90)
RDW: 13.3 % (ref 11.5–14.5)
WBC: 10.3 10*3/uL (ref 3.8–10.6)

## 2014-07-19 LAB — URINALYSIS COMPLETE WITH MICROSCOPIC (ARMC ONLY)
Bilirubin Urine: NEGATIVE
Glucose, UA: NEGATIVE mg/dL
Ketones, ur: NEGATIVE mg/dL
Leukocytes, UA: NEGATIVE
Nitrite: NEGATIVE
PH: 6 (ref 5.0–8.0)
Protein, ur: NEGATIVE mg/dL
SPECIFIC GRAVITY, URINE: 1.015 (ref 1.005–1.030)

## 2014-07-19 MED ORDER — SODIUM CHLORIDE 0.9 % IV BOLUS (SEPSIS): 1000.0000 mL | Freq: Once | INTRAVENOUS | Status: DC

## 2014-07-19 MED ORDER — TAMSULOSIN HCL 0.4 MG PO CAPS: 0.4000 mg | ORAL_CAPSULE | Freq: Every day | ORAL | Status: DC

## 2014-07-19 MED ORDER — ONDANSETRON HCL 4 MG PO TABS: 4.0000 mg | ORAL_TABLET | Freq: Four times a day (QID) | ORAL | Status: DC

## 2014-07-19 MED ORDER — HYDROCODONE-ACETAMINOPHEN 5-325 MG PO TABS: 2.0000 | ORAL_TABLET | ORAL | Status: DC | PRN

## 2014-07-19 MED ORDER — KETOROLAC TROMETHAMINE 10 MG PO TABS: 10.0000 mg | ORAL_TABLET | Freq: Four times a day (QID) | ORAL | Status: DC | PRN

## 2014-07-19 MED ORDER — ONDANSETRON 8 MG PO TBDP
8.0000 mg | ORAL_TABLET | Freq: Once | ORAL | Status: AC
  Administered 2014-07-19: 8 mg via ORAL

## 2014-07-19 MED ORDER — KETOROLAC TROMETHAMINE 60 MG/2ML IM SOLN
60.0000 mg | Freq: Once | INTRAMUSCULAR | Status: AC
  Administered 2014-07-19: 60 mg via INTRAMUSCULAR

## 2014-07-19 NOTE — Discharge Instructions (Signed)
Strain all of your urine. Follow-up with your urologist in several days. Go to the ER if you get worse, if you have pain or nausea/vomiting not controlled medications, fever above 100.4, or other concerns

## 2014-07-19 NOTE — ED Provider Notes (Signed)
HPI  SUBJECTIVE:  Steven Clements is a 59 y.o. male who presents with the acute onset of sharp, stabbing, squeezing left back pain with some radiation to his side. This started 3 hours prior to arrival He reports nausea, 6 episodes of emesis. No fevers. No urinary urgency, frequency, cloudy or odorous urine, hematuria, patient states that he feels that he can completely empty his bladder. He denies abdominal pain, testicular pain. no trauma to the back. He tried increasing fluids, walking around. His symptoms are better with walking around, worse with sitting down, lying still. Past medical history of nonobstructing nephrolithiasis. Family history significant for nephrolithiasis. Patient states that this episode was triggered off by not drinking enough fluids while working out in the sun yesterday. No history of diabetes, hypertension, MI, coronary artery disease.  Past Medical History  Diagnosis Date  . Kidney stones     Past Surgical History  Procedure Laterality Date  . Lithotripsy      History reviewed. No pertinent family history.  History  Substance Use Topics  . Smoking status: Former Games developer  . Smokeless tobacco: Not on file  . Alcohol Use: No     Current facility-administered medications:  .  sodium chloride 0.9 % bolus 1,000 mL, 1,000 mL, Intravenous, Once, Domenick Gong, MD, 1,000 mL at 07/19/14 1601  Current outpatient prescriptions:  .  HYDROcodone-acetaminophen (NORCO/VICODIN) 5-325 MG per tablet, Take 2 tablets by mouth every 4 (four) hours as needed for moderate pain., Disp: 20 tablet, Rfl: 0 .  ketorolac (TORADOL) 10 MG tablet, Take 1 tablet (10 mg total) by mouth 4 (four) times daily as needed., Disp: 20 tablet, Rfl: 0 .  ondansetron (ZOFRAN) 4 MG tablet, Take 1 tablet (4 mg total) by mouth every 6 (six) hours., Disp: 12 tablet, Rfl: 0 .  tamsulosin (FLOMAX) 0.4 MG CAPS capsule, Take 1 capsule (0.4 mg total) by mouth at bedtime., Disp: 14 capsule, Rfl:  0  No Known Allergies   ROS  As noted in HPI.   Physical Exam  BP 123/72 mmHg  Pulse 76  Temp(Src) 97.6 F (36.4 C) (Tympanic)  Resp 18  Ht 6\' 1"  (1.854 m)  Wt 250 lb (113.399 kg)  BMI 32.99 kg/m2  SpO2 98%  Constitutional: Well developed, well nourished, walking around the room, uncomfortable Eyes: PERRL, EOMI, conjunctiva normal bilaterally HENT: Normocephalic, atraumatic,mucus membranes moist Respiratory: Clear to auscultation bilaterally, no rales, no wheezing, no rhonchi Cardiovascular: Normal rate and rhythm, no murmurs, no gallops, no rubs GI: Soft, nondistended, normal bowel sounds, nontender, no rebound, no guarding. No suprapubic, flank tenderness. Back: Left-sided CVA tenderness. Mild left-sided abdominal tenderness in the midaxillary line. skin: No rash, skin intact Musculoskeletal: No edema, no tenderness, no deformities Neurologic: Alert & oriented x 3, CN II-XII grossly intact, no motor deficits, sensation grossly intact Psychiatric: Speech and behavior appropriate   ED Course   Medications  sodium chloride 0.9 % bolus 1,000 mL (1,000 mLs Intravenous Not Given 07/19/14 1601)  ondansetron (ZOFRAN-ODT) disintegrating tablet 8 mg (8 mg Oral Given 07/19/14 1504)  ketorolac (TORADOL) injection 60 mg (60 mg Intramuscular Given 07/19/14 1510)    Orders Placed This Encounter  Procedures  . Urine culture    Standing Status: Standing     Number of Occurrences: 1     Standing Expiration Date:     Order Specific Question:  Patient immune status    Answer:  Normal  . Urinalysis complete, with microscopic    Standing Status: Standing  Number of Occurrences: 1     Standing Expiration Date:   . CBC with Differential    Standing Status: Standing     Number of Occurrences: 1     Standing Expiration Date:   . Basic metabolic panel    Standing Status: Standing     Number of Occurrences: 1     Standing Expiration Date:   . Strain all urine    Send pt home  with urine strainer   Results for orders placed or performed during the hospital encounter of 07/19/14 (from the past 24 hour(s))  Urinalysis complete, with microscopic     Status: Abnormal   Collection Time: 07/19/14  3:02 PM  Result Value Ref Range   Color, Urine YELLOW YELLOW   APPearance HAZY (A) CLEAR   Glucose, UA NEGATIVE NEGATIVE mg/dL   Bilirubin Urine NEGATIVE NEGATIVE   Ketones, ur NEGATIVE NEGATIVE mg/dL   Specific Gravity, Urine 1.015 1.005 - 1.030   Hgb urine dipstick 2+ (A) NEGATIVE   pH 6.0 5.0 - 8.0   Protein, ur NEGATIVE NEGATIVE mg/dL   Nitrite NEGATIVE NEGATIVE   Leukocytes, UA NEGATIVE NEGATIVE   RBC / HPF 6-30 <3 RBC/hpf   WBC, UA 0-5 <3 WBC/hpf   Bacteria, UA RARE RARE   Squamous Epithelial / LPF 0-5 (A) RARE   Trans Epithel, UA PRESENT    WBC Clumps PRESENT    RBC Clump PRESENT    Mucous PRESENT   CBC with Differential     Status: Abnormal   Collection Time: 07/19/14  3:55 PM  Result Value Ref Range   WBC 10.3 3.8 - 10.6 K/uL   RBC 5.12 4.40 - 5.90 MIL/uL   Hemoglobin 16.1 13.0 - 18.0 g/dL   HCT 16.146.8 09.640.0 - 04.552.0 %   MCV 91.4 80.0 - 100.0 fL   MCH 31.5 26.0 - 34.0 pg   MCHC 34.5 32.0 - 36.0 g/dL   RDW 40.913.3 81.111.5 - 91.414.5 %   Platelets 154 150 - 440 K/uL   Neutrophils Relative % 82 %   Neutro Abs 8.4 (H) 1.4 - 6.5 K/uL   Lymphocytes Relative 10 %   Lymphs Abs 1.1 1.0 - 3.6 K/uL   Monocytes Relative 6 %   Monocytes Absolute 0.6 0.2 - 1.0 K/uL   Eosinophils Relative 1 %   Eosinophils Absolute 0.1 0 - 0.7 K/uL   Basophils Relative 1 %   Basophils Absolute 0.1 0 - 0.1 K/uL  Basic metabolic panel     Status: Abnormal   Collection Time: 07/19/14  3:55 PM  Result Value Ref Range   Sodium 135 135 - 145 mmol/L   Potassium 3.9 3.5 - 5.1 mmol/L   Chloride 99 (L) 101 - 111 mmol/L   CO2 25 22 - 32 mmol/L   Glucose, Bld 128 (H) 65 - 99 mg/dL   BUN 15 6 - 20 mg/dL   Creatinine, Ser 7.821.03 0.61 - 1.24 mg/dL   Calcium 9.3 8.9 - 95.610.3 mg/dL   GFR calc non Af  Amer >60 >60 mL/min   GFR calc Af Amer >60 >60 mL/min   Anion gap 11 5 - 15   No results found. Results for orders placed or performed during the hospital encounter of 07/19/14  Urinalysis complete, with microscopic  Result Value Ref Range   Color, Urine YELLOW YELLOW   APPearance HAZY (A) CLEAR   Glucose, UA NEGATIVE NEGATIVE mg/dL   Bilirubin Urine NEGATIVE NEGATIVE   Ketones, ur NEGATIVE  NEGATIVE mg/dL   Specific Gravity, Urine 1.015 1.005 - 1.030   Hgb urine dipstick 2+ (A) NEGATIVE   pH 6.0 5.0 - 8.0   Protein, ur NEGATIVE NEGATIVE mg/dL   Nitrite NEGATIVE NEGATIVE   Leukocytes, UA NEGATIVE NEGATIVE   RBC / HPF 6-30 <3 RBC/hpf   WBC, UA 0-5 <3 WBC/hpf   Bacteria, UA RARE RARE   Squamous Epithelial / LPF 0-5 (A) RARE   Trans Epithel, UA PRESENT    WBC Clumps PRESENT    RBC Clump PRESENT    Mucous PRESENT   CBC with Differential  Result Value Ref Range   WBC 10.3 3.8 - 10.6 K/uL   RBC 5.12 4.40 - 5.90 MIL/uL   Hemoglobin 16.1 13.0 - 18.0 g/dL   HCT 16.1 09.6 - 04.5 %   MCV 91.4 80.0 - 100.0 fL   MCH 31.5 26.0 - 34.0 pg   MCHC 34.5 32.0 - 36.0 g/dL   RDW 40.9 81.1 - 91.4 %   Platelets 154 150 - 440 K/uL   Neutrophils Relative % 82 %   Neutro Abs 8.4 (H) 1.4 - 6.5 K/uL   Lymphocytes Relative 10 %   Lymphs Abs 1.1 1.0 - 3.6 K/uL   Monocytes Relative 6 %   Monocytes Absolute 0.6 0.2 - 1.0 K/uL   Eosinophils Relative 1 %   Eosinophils Absolute 0.1 0 - 0.7 K/uL   Basophils Relative 1 %   Basophils Absolute 0.1 0 - 0.1 K/uL  Basic metabolic panel  Result Value Ref Range   Sodium 135 135 - 145 mmol/L   Potassium 3.9 3.5 - 5.1 mmol/L   Chloride 99 (L) 101 - 111 mmol/L   CO2 25 22 - 32 mmol/L   Glucose, Bld 128 (H) 65 - 99 mg/dL   BUN 15 6 - 20 mg/dL   Creatinine, Ser 7.82 0.61 - 1.24 mg/dL   Calcium 9.3 8.9 - 95.6 mg/dL   GFR calc non Af Amer >60 >60 mL/min   GFR calc Af Amer >60 >60 mL/min   Anion gap 11 5 - 15    ED Clinical  Impression  Nephrolithiasis   ED Assessment/Plan [facility] narcotic database reviewed.  No narcotic prescriptions in the past 6 months Toradol IV fluids and Zofran. Patient declined narcotic pain medication. CBC, BMP to evaluate kidney function. UA. We'll reevaluate.  Reevaluation patient states he feels significantly better. Appears comfortable. Tolerating by mouth.  Reviewed labs,  normal BUN/creatinine, no white count. UA positive for blood no UTI. No antibiotics today. Sending off for culture, will Rx appropriate antibiotics if comes back positive for UTI.  Given patient has a history of nonobstructing nephrolithiasis, is tolerating by mouth, got better with treatment here, has no evidence of obstruction on labs, has no white count, we'll treat as outpatient. Home with Flomax, Toradol, Norco, Zofran. Will have patient follow-up with his urologist as needed.    Discussed labs, MDM, plan and followup with patient. Discussed sn/sx that should prompt return to the UC or ED. Patient agrees with plan.   *This clinic note was created using Dragon dictation software. Therefore, there may be occasional mistakes despite careful proofreading.  ?  Domenick Gong, MD 07/19/14 1650

## 2014-07-19 NOTE — ED Notes (Signed)
Pt having back/flank pain started around noon today. Nausea and vomiting. PMH kidney stones.

## 2014-07-21 LAB — URINE CULTURE: Special Requests: NORMAL

## 2016-01-22 ENCOUNTER — Ambulatory Visit
Admission: EM | Admit: 2016-01-22 | Discharge: 2016-01-22 | Disposition: A | Discharge status: Home / Self Care | Payer: Commercial Managed Care - PPO | Attending: Family Medicine | Admitting: Family Medicine

## 2016-01-22 ENCOUNTER — Encounter: Payer: Self-pay | Admitting: Emergency Medicine

## 2016-01-22 DIAGNOSIS — J0100 Acute maxillary sinusitis, unspecified: Secondary | ICD-10-CM | POA: Diagnosis not present

## 2016-01-22 DIAGNOSIS — J069 Acute upper respiratory infection, unspecified: Secondary | ICD-10-CM

## 2016-01-22 MED ORDER — ALBUTEROL SULFATE HFA 108 (90 BASE) MCG/ACT IN AERS: 2.0000 | INHALATION_SPRAY | RESPIRATORY_TRACT | 0 refills | Status: DC | PRN

## 2016-01-22 MED ORDER — HYDROCOD POLST-CPM POLST ER 10-8 MG/5ML PO SUER: 5.0000 mL | Freq: Every evening | ORAL | 0 refills | Status: DC | PRN

## 2016-01-22 MED ORDER — DOXYCYCLINE HYCLATE 100 MG PO CAPS: 100.0000 mg | ORAL_CAPSULE | Freq: Two times a day (BID) | ORAL | 0 refills | Status: DC

## 2016-01-22 NOTE — Discharge Instructions (Signed)
Take medication as prescribed. Rest. Drink plenty of fluids.  ° °Follow up with your primary care physician this week as needed. Return to Urgent care for new or worsening concerns.  ° °

## 2016-01-22 NOTE — ED Provider Notes (Signed)
[facility] ____________________________________________  Time seen: Approximately 7:41 PM  I have reviewed the triage vital signs and the nursing notes.   HISTORY  Chief Complaint Nasal Congestion and Cough  HPI Steven Clements is a 61 y.o. male presenting for the complaints of 1 week of runny nose, nasal congestion, sinus pressure, sinus drainage and cough. Patient reports cough intermittently waking him up at night and occasional hearing himself wheezing. Denies continued wheezing. Denies current shortness of breath. Patient reports as long as he breathes through his mouth, he can breathe fine. Reports cough worse at night. States some intermittent cough during the day. Denies known fevers, but does report some intermittent chills. Reports daughter and grandchild recently sick with similar complaints. Reports symptoms have been unresolved with over-the-counter Mucinex and cough and congestion, a combination agents. Also reports using over-the-counter nasal saline rinses with some improvement of nasal congestion.   Denies chest pain or chest pain with deep breath. Reports has continued to eat and drink well. Denies any recent sickness, recent hospitalization or recent antibiotic use. Denies cardiac history or renal insufficiency.  PCP: Zada Finderslmedo  Past Medical History:  Diagnosis Date  . Kidney stones     There are no active problems to display for this patient.   Past Surgical History:  Procedure Laterality Date  . LITHOTRIPSY        No current facility-administered medications for this encounter.   Current Outpatient Prescriptions:  .  albuterol (PROVENTIL HFA;VENTOLIN HFA) 108 (90 Base) MCG/ACT inhaler, Inhale 2 puffs into the lungs every 4 (four) hours as needed., Disp: 1 Inhaler, Rfl: 0 .  chlorpheniramine-HYDROcodone (TUSSIONEX PENNKINETIC ER) 10-8 MG/5ML SUER, Take 5 mLs by mouth at bedtime as needed for cough. do not drive or operate machinery while  taking as can cause drowsiness., Disp: 75 mL, Rfl: 0 .  doxycycline (VIBRAMYCIN) 100 MG capsule, Take 1 capsule (100 mg total) by mouth 2 (two) times daily., Disp: 20 capsule, Rfl: 0  Allergies Patient has no known allergies.  History reviewed. No pertinent family history.  Social History Social History  Substance Use Topics  . Smoking status: Former Games developermoker  . Smokeless tobacco: Never Used  . Alcohol use No    Review of Systems Constitutional: As above.  Eyes: No visual changes. ENT: No sore throat. Cardiovascular: Denies chest pain. Respiratory: Denies shortness of breath. Gastrointestinal: No abdominal pain.  No nausea, no vomiting.  No diarrhea.  No constipation. Genitourinary: Negative for dysuria. Musculoskeletal: Negative for back pain. Skin: Negative for rash. Neurological: Negative for headaches, focal weakness or numbness.  10-point ROS otherwise negative.  ____________________________________________   PHYSICAL EXAM:  VITAL SIGNS: ED Triage Vitals  Enc Vitals Group     BP 01/22/16 1904 135/74     Pulse Rate 01/22/16 1904 86     Resp 01/22/16 1904 16     Temp 01/22/16 1904 98.1 F (36.7 C)     Temp Source 01/22/16 1904 Oral     SpO2 01/22/16 1904 99 %     Weight 01/22/16 1905 240 lb (108.9 kg)     Height 01/22/16 1905 6\' 1"  (1.854 m)     Head Circumference --      Peak Flow --      Pain Score 01/22/16 1906 3     Pain Loc --      Pain Edu? --      Excl. in GC? --     Constitutional: Alert and oriented. Well appearing and in  no acute distress. Eyes: Conjunctivae are normal. PERRL. EOMI. Head: Atraumatic.Mild tenderness to palpation bilateral maxillary sinuses, no frontal sinus tenderness to palpation. No swelling. No erythema.   Ears: no erythema, normal TMs bilaterally.   Nose: nasal congestion with bilateral nasal turbinate erythema and edema.   Mouth/Throat: Mucous membranes are moist.  Oropharynx non-erythematous.No tonsillar swelling or exudate.   Neck: No stridor.  No cervical spine tenderness to palpation. Hematological/Lymphatic/Immunilogical: No cervical lymphadenopathy. Cardiovascular: Normal rate, regular rhythm. Grossly normal heart sounds.  Good peripheral circulation. Respiratory: Normal respiratory effort.  No retractions.No wheezes, rales or rhonchi. Good air movement. Dry intermittent cough noted in room. Slight bronchospasm wheeze noted with cough. Speaks in complete sentences. Gastrointestinal: Soft and nontender. No CVA tenderness.  Musculoskeletal: No lower or upper extremity tenderness nor edema.  No cervical, thoracic or lumbar tenderness to palpation.  Neurologic:  Normal speech and language. No gait instability. Skin:  Skin is warm, dry and intact. No rash noted. Psychiatric: Mood and affect are normal. Speech and behavior are normal.  ___________________________________________   LABS (all labs ordered are listed, but only abnormal results are displayed)  Labs Reviewed - No data to display ____________________________________________   PROCEDURES Procedures    INITIAL IMPRESSION / ASSESSMENT AND PLAN / ED COURSE  Pertinent labs & imaging results that were available during my care of the patient were reviewed by me and considered in my medical decision making (see chart for details).  Well-appearing patient. No acute distress. Suspect maxillary sinusitis and bronchitis. Encouraged supportive care, rest, fluids, nasal saline rinses. Will start patient on oral doxycycline, when necessary Tussionex at night as well as when necessary albuterol inhaler. [place] controlled substance database reviewed and last controlled substance documented 08/31/2015 quantity #13 oxycodone 10 mg tablets.Discussed indication, risks and benefits of medications with patient.   Discussed follow up with Primary care physician this week. Discussed follow up and return parameters including no resolution or any worsening concerns. Patient  verbalized understanding and agreed to plan.   ____________________________________________   FINAL CLINICAL IMPRESSION(S) / ED DIAGNOSES  Final diagnoses:  Acute maxillary sinusitis, recurrence not specified  Upper respiratory tract infection, unspecified type     Discharge Medication List as of 01/22/2016  7:55 PM    START taking these medications   Details  albuterol (PROVENTIL HFA;VENTOLIN HFA) 108 (90 Base) MCG/ACT inhaler Inhale 2 puffs into the lungs every 4 (four) hours as needed., Starting Wed 01/22/2016, Normal    chlorpheniramine-HYDROcodone (TUSSIONEX PENNKINETIC ER) 10-8 MG/5ML SUER Take 5 mLs by mouth at bedtime as needed for cough. do not drive or operate machinery while taking as can cause drowsiness., Starting Wed 01/22/2016, Print    doxycycline (VIBRAMYCIN) 100 MG capsule Take 1 capsule (100 mg total) by mouth 2 (two) times daily., Starting Wed 01/22/2016, Normal        Note: This dictation was prepared with Dragon dictation along with smaller phrase technology. Any transcriptional errors that result from this process are unintentional.    Clinical Course       Renford Dills, NP 01/22/16 2103

## 2016-01-22 NOTE — ED Triage Notes (Signed)
Patient c/o stuffy nose, nasal congestion, cough and chest congestion a week ago.  Patient denies fevers.

## 2016-01-29 ENCOUNTER — Encounter: Payer: Self-pay | Admitting: *Deleted

## 2016-01-29 ENCOUNTER — Ambulatory Visit
Admission: EM | Admit: 2016-01-29 | Discharge: 2016-01-29 | Disposition: A | Discharge status: Home / Self Care | Payer: Commercial Managed Care - PPO | Attending: Family Medicine | Admitting: Family Medicine

## 2016-01-29 DIAGNOSIS — J0100 Acute maxillary sinusitis, unspecified: Secondary | ICD-10-CM | POA: Diagnosis not present

## 2016-01-29 DIAGNOSIS — J3089 Other allergic rhinitis: Secondary | ICD-10-CM | POA: Diagnosis not present

## 2016-01-29 MED ORDER — DM-GUAIFENESIN ER 30-600 MG PO TB12: 1.0000 | ORAL_TABLET | Freq: Two times a day (BID) | ORAL | 0 refills | Status: DC

## 2016-01-29 MED ORDER — FLUTICASONE PROPIONATE 50 MCG/ACT NA SUSP: 1.0000 | Freq: Every day | NASAL | 1 refills | Status: DC

## 2016-01-29 MED ORDER — FEXOFENADINE HCL 180 MG PO TABS: 180.0000 mg | ORAL_TABLET | Freq: Every day | ORAL | 0 refills | Status: DC

## 2016-01-29 NOTE — ED Provider Notes (Signed)
CSN: [id number]     Arrival date & time 01/29/16  1419 History   First MD Initiated Contact with Patient 01/29/16 1546     Chief Complaint  Patient presents with  . Nasal Congestion  . Cough  . Sore Throat  . Fever   (Consider location/radiation/quality/duration/timing/severity/associated sxs/prior Treatment) HPI: 61 yo male FU on previous visit for Sinusitis , c/o nasal congestion, cough and sore throat, worse in the morning. Denies fever/chills. Denies SOB, wheezing. On Doxycycline day 7.   Past Medical History:  Diagnosis Date  . Kidney stones    Past Surgical History:  Procedure Laterality Date  . LITHOTRIPSY     History reviewed. No pertinent family history. Social History  Substance Use Topics  . Smoking status: Former Games developermoker  . Smokeless tobacco: Never Used  . Alcohol use No    Review of Systems  HENT: Positive for congestion, postnasal drip, sinus pressure and sore throat.     Allergies  Patient has no known allergies.  Home Medications   Prior to Admission medications   Medication Sig Start Date End Date Taking? Authorizing Provider  albuterol (PROVENTIL HFA;VENTOLIN HFA) 108 (90 Base) MCG/ACT inhaler Inhale 2 puffs into the lungs every 4 (four) hours as needed. 01/22/16  Yes Renford DillsLindsey Miller, NP  chlorpheniramine-HYDROcodone [facility](TUSSIONEX PENNKINETIC ER) 10-8 MG/5ML SUER Take 5 mLs by mouth at bedtime as needed for cough. do not drive or operate machinery while taking as can cause drowsiness. 01/22/16  Yes Renford DillsLindsey Miller, NP  doxycycline (VIBRAMYCIN) 100 MG capsule Take 1 capsule (100 mg total) by mouth 2 (two) times daily. 01/22/16  Yes Renford DillsLindsey Miller, NP  dextromethorphan-guaiFENesin [facility](MUCINEX DM) 30-600 MG 12hr tablet Take 1 tablet by mouth 2 (two) times daily. 01/29/16   Sahmir Weatherbee, NP  fexofenadine (ALLEGRA) 180 MG tablet Take 1 tablet (180 mg total) by mouth daily. 01/29/16   Shifa Brisbon, NP  fluticasone (FLONASE) 50 MCG/ACT nasal spray Place 1 spray into  both nostrils daily. 01/29/16   Trayson Stitely, NP   Meds Ordered and Administered this Visit  Medications - No data to display  BP 128/78 (BP Location: Left Arm)   Pulse 89   Temp 97.9 F (36.6 C) (Oral)   Resp 18   Ht 6\' 1"  (1.854 m)   Wt 240 lb (108.9 kg)   SpO2 97%   BMI 31.66 kg/m  No data found.   Physical Exam  Constitutional: He appears well-developed and well-nourished.  HENT:  Right Ear: Hearing and tympanic membrane normal.  Left Ear: Hearing and tympanic membrane normal.  Nose: Mucosal edema present. Right sinus exhibits no maxillary sinus tenderness and no frontal sinus tenderness. Left sinus exhibits no maxillary sinus tenderness and no frontal sinus tenderness.  Mouth/Throat: Uvula is midline and oropharynx is clear and moist. No oropharyngeal exudate, posterior oropharyngeal edema, posterior oropharyngeal erythema or tonsillar abscesses.  Pulmonary/Chest: Effort normal and breath sounds normal. No respiratory distress. He has no wheezes. He has no rales.  Sx likely from nasal congestion and post nasal drip.  Urgent Care Course   Clinical Course     Procedures (including critical care time)  Labs Review Labs Reviewed - No data to display  Imaging Review No results found.   Visual Acuity Review  Right Eye Distance:   Left Eye Distance:   Bilateral Distance:    Right Eye Near:   Left Eye Near:    Bilateral Near:         MDM   1.  Acute maxillary sinusitis, recurrence not specified   2. Acute allergic rhinitis due to other allergen, unspecified seasonality   Sinusitis Sx improving. Advised to continue and complete course of Doxycycline. Start Flonase , Careers adviser and Mucinex as directed     [employer], NP 01/29/16 1604

## 2016-01-29 NOTE — ED Triage Notes (Signed)
Patient continues to have the same symptoms since his visit on 01/22/16.

## 2018-11-26 ENCOUNTER — Encounter: Payer: Self-pay | Admitting: Emergency Medicine

## 2018-11-26 ENCOUNTER — Ambulatory Visit
Admission: EM | Admit: 2018-11-26 | Discharge: 2018-11-26 | Disposition: A | Discharge status: Home / Self Care | Payer: Commercial Managed Care - PPO | Attending: Family Medicine | Admitting: Family Medicine

## 2018-11-26 ENCOUNTER — Other Ambulatory Visit: Payer: Self-pay

## 2018-11-26 DIAGNOSIS — R21 Rash and other nonspecific skin eruption: Secondary | ICD-10-CM

## 2018-11-26 DIAGNOSIS — L237 Allergic contact dermatitis due to plants, except food: Secondary | ICD-10-CM | POA: Diagnosis not present

## 2018-11-26 DIAGNOSIS — L255 Unspecified contact dermatitis due to plants, except food: Secondary | ICD-10-CM

## 2018-11-26 MED ORDER — PREDNISONE 10 MG PO TABS: ORAL_TABLET | ORAL | 0 refills | Status: DC

## 2018-11-26 NOTE — ED Triage Notes (Signed)
Patient states that he cut down an oak tree on Wed and Thursday.  Patient c/o red itchy rash that started last night on his forearms.

## 2018-11-26 NOTE — ED Provider Notes (Signed)
[facility] ____________________________________________  Time seen: Approximately 10:27 AM  I have reviewed the triage vital signs and the nursing notes.   HISTORY  Chief Complaint Rash   HPI Steven Clements is a 63 y.o. male presenting for evaluation of itchy rash to bilateral forearms, right leg and spreading.  Patient reports this past week he had to cut up a very long oak tree in his daughter's yard that had a lot of vines and other plants growing on it.  Patient believes he was exposed to poison ivy or sumac.  Reports shortly after he developed itchy rash to his forearms that has continued to spread.  Has used over-the-counter calamine without resolution.  Denies pain.  Denies cough, chest pain, shortness of breath or fevers.  Reports otherwise doing well.    Past Medical History:  Diagnosis Date  . Kidney stones     There are no active problems to display for this patient.   Past Surgical History:  Procedure Laterality Date  . LITHOTRIPSY       No current facility-administered medications for this encounter.   Current Outpatient Medications:  .  predniSONE (DELTASONE) 10 MG tablet, Take 60mg  orally day one, then 55 mg orally day two, then 50 mg orally day three, then taper by 5 mg daily over 12 days until complete., Disp: 35 tablet, Rfl: 0  Allergies Oxycodone  Family History  Problem Relation Age of Onset  . Hypertension Mother   . Atrial fibrillation Mother   . Cancer Father     Social History Social History   Tobacco Use  . Smoking status: Former  . Smokeless tobacco: Never Used  Substance Use Topics  . Alcohol use: No  . Drug use: Not on file    Review of Systems Constitutional: No fever ENT: No sore throat. Cardiovascular: Denies chest pain. Respiratory: Denies shortness of breath. Skin: Positive for rash.   ____________________________________________   PHYSICAL EXAM:  VITAL SIGNS: ED Triage Vitals   Enc Vitals Group     BP 11/26/18 1011 105/61     Pulse Rate 11/26/18 1011 84     Resp 11/26/18 1011 16     Temp 11/26/18 1011 98.4 F (36.9 C)     Temp Source 11/26/18 1011 Oral     SpO2 11/26/18 1011 98 %     Weight 11/26/18 1008 242 lb (109.8 kg)     Height 11/26/18 1008 6\' 1"  (1.854 m)     Head Circumference --      Peak Flow --      Pain Score 11/26/18 1008 0     Pain Loc --      Pain Edu? --      Excl. in GC? --     Constitutional: Alert and oriented. Well appearing and in no acute distress. Eyes: Conjunctivae are normal.  ENT      Head: Normocephalic and atraumatic. Cardiovascular: Normal rate, regular rhythm. Grossly normal heart sounds.  Good peripheral circulation. Respiratory: Normal respiratory effort without tachypnea nor retractions. Breath sounds are clear and equal bilaterally. No wheezes, rales, rhonchi. Musculoskeletal:  Steady gait.  Neurologic:  Normal speech and language. Speech is normal. No gait instability.  Skin:  Skin is warm, dry.  Except: Mildly erythematous papular clustered pruritic rash to bilateral forearms, bilateral upper arms, no drainage, no edema, nontender. Psychiatric: Mood and affect are normal. Speech and behavior are normal. Patient exhibits appropriate insight and judgment   ___________________________________________   LABS (all  labs ordered are listed, but only abnormal results are displayed)  Labs Reviewed - No data to display ____________________________________________   PROCEDURES Procedures    INITIAL IMPRESSION / ASSESSMENT AND PLAN / ED COURSE  Pertinent labs & imaging results that were available during my care of the patient were reviewed by me and considered in my medical decision making (see chart for details).  Well-appearing patient.  No acute distress.  Subjective report and on appearance consistent with contact dermatitis from plant-based.  Will treat with prednisone taper, continue supportive care.  Avoidance  of trigger. Discussed indication, risks and benefits of medications with patient.   Discussed follow up and return parameters including no resolution or any worsening concerns. Patient verbalized understanding and agreed to plan.   ____________________________________________   FINAL CLINICAL IMPRESSION(S) / ED DIAGNOSES  Final diagnoses:  Contact dermatitis due to plants, except food, unspecified contact dermatitis type     ED Discharge Orders         Ordered    predniSONE (DELTASONE) 10 MG tablet     11/26/18 1022           Note: This dictation was prepared with Dragon dictation along with smaller phrase technology. Any transcriptional errors that result from this process are unintentional.         Marylene Land, NP 11/26/18 1029

## 2018-11-26 NOTE — Discharge Instructions (Addendum)
Take medication as prescribed. Drink plenty of fluids. Avoid trigger.  Follow up with your primary care physician this week as needed. Return to Urgent care for new or worsening concerns.

## 2018-12-01 ENCOUNTER — Other Ambulatory Visit: Payer: Self-pay

## 2018-12-01 ENCOUNTER — Ambulatory Visit
Admission: EM | Admit: 2018-12-01 | Discharge: 2018-12-01 | Disposition: A | Discharge status: Home / Self Care | Payer: Commercial Managed Care - PPO | Attending: Urgent Care | Admitting: Urgent Care

## 2018-12-01 DIAGNOSIS — L237 Allergic contact dermatitis due to plants, except food: Secondary | ICD-10-CM | POA: Diagnosis not present

## 2018-12-01 MED ORDER — HYDROXYZINE HCL 25 MG PO TABS: 25.0000 mg | ORAL_TABLET | Freq: Three times a day (TID) | ORAL | 0 refills | Status: DC | PRN

## 2018-12-01 MED ORDER — TRIAMCINOLONE ACETONIDE 0.1 % EX CREA: 1.0000 "application " | TOPICAL_CREAM | Freq: Two times a day (BID) | CUTANEOUS | 0 refills | Status: DC

## 2018-12-01 NOTE — ED Triage Notes (Signed)
Pt states he saw here on Saturday. Pt states his poison ivy is spreading.  Pt states she is taking prednisone. Pt states the rash  sit feels like pens and needles.

## 2018-12-01 NOTE — ED Provider Notes (Signed)
[place], [place]   Name: Steven Clements DOB: 03/29/1955 MRN: [id number] CSN: [id number] PCP: Patient, No Pcp Per  Arrival date and time:  12/01/18 5364  Chief Complaint:  Rash   NOTE: Prior to seeing the patient today, I have reviewed the triage nursing documentation and vital signs. Clinical staff has updated patient's PMH/PSHx, current medication list, and drug allergies/intolerances to ensure comprehensive history available to assist in medical decision making.   History:   HPI: Steven Clements is a 63 y.o. male who presents today with complaints of rash to his BILATERAL upper extremities and RIGHT thigh that initially declared after known exposure to poison sumac. Rash started on his amrs and has progressively spread. Of note, patient was seen here on 11/26/2018 by Hyacinth Meeker, NP for the same; notes reviewed. Patient was treated for plant based contacted dermatitis with a 12 day prednisone taper. Patient presents today advising that he has been using the medication as prescribed, however the oral steroids are no improving his rash. He feels as if the rash continues to spread. Rash is erythematous and pruritic. He has not appreciated any drainage associated with the rash. There is no facial involvement; no rash to periorbital, paranasal, or perioral areas. Patient denies that he is not experiencing any shortness of breath or sensation of pharyngeal/laryngeal fullness. In efforts to help reduce/relieve the associated pruritis, the patient advising that he has been rubbing banana peels on his skin. He has also been using topical diphenhydramine and Zanfel at home. Patient notes that conservative symptomatic management has proven to be ineffective overall.   Past Medical History:  Diagnosis Date   Kidney stones     Past Surgical History:  Procedure Laterality Date   LITHOTRIPSY      Family History  Problem Relation Age of Onset   Hypertension Mother    Atrial  fibrillation Mother    Cancer Father     Social History   Tobacco Use   Smoking status: Former Smoker   Smokeless tobacco: Never Used  Substance Use Topics   Alcohol use: No   Drug use: Not on file    There are no active problems to display for this patient.   Home Medications:    No outpatient medications have been marked as taking for the 12/01/18 encounter [facility] Encounter).    Allergies:   Oxycodone  Review of Systems (ROS): Review of Systems  Constitutional: Negative for chills and fever.  HENT: Negative for drooling and trouble swallowing.   Respiratory: Negative for cough and shortness of breath.   Cardiovascular: Negative for chest pain and palpitations.  Skin: Positive for color change and rash.  All other systems reviewed and are negative.    Vital Signs: Today's Vitals   12/01/18 1006 12/01/18 1010 12/01/18 1022  BP:  135/75   Pulse:  86   Resp:  18   Temp:  98.6 F (37 C)   TempSrc:  Oral   SpO2:  98%   Weight: 242 lb (109.8 kg)    PainSc: 2   3     Physical Exam: Physical Exam  Constitutional: He is oriented to person, place, and time and well-developed, well-nourished, and in no distress.  HENT:  Head: Normocephalic and atraumatic.  Mouth/Throat: Uvula is midline and mucous membranes are normal. No posterior oropharyngeal edema or posterior oropharyngeal erythema.  Eyes: Pupils are equal, round, and reactive to light.  Cardiovascular: Normal rate, regular rhythm, normal heart sounds and intact distal pulses.  Pulmonary/Chest:  Effort normal and breath sounds normal. No respiratory distress.  Neurological: He is alert and oriented to person, place, and time. Gait normal.  Skin: Skin is warm and dry. Rash noted. Rash is maculopapular (BUE and RIGHT thigh; (+) erythematous and pruritic; no tenderness or drainage).  Psychiatric: Mood, memory, affect and judgment normal.  Nursing note and vitals reviewed.   Urgent Care Treatments /  Results:   LABS: PLEASE NOTE: all labs that were ordered this encounter are listed, however only abnormal results are displayed. Labs Reviewed - No data to display  EKG: -None  RADIOLOGY: No results found.  PROCEDURES: Procedures  MEDICATIONS RECEIVED THIS VISIT: Medications - No data to display  PERTINENT CLINICAL COURSE NOTES/UPDATES:   Initial Impression / Assessment and Plan / Urgent Care Course:  Pertinent labs & imaging results that were available during my care of the patient were personally reviewed by me and considered in my medical decision making (see lab/imaging section of note for values and interpretations).  Steven Clements is a 63 y.o. male who presents to [facility] today with complaints of Rash   Patient is well appearing overall in clinic today. He does not appear to be in any acute distress. Presenting symptoms (see HPI) and exam as documented above. Rash continues to spread despite current treatment with systemic steroids. Patient using topical interventions as well. Encouraged to discontinue topical diphenhydramine, as research suggests that this can make plant based dermatitis worse. Patient to continue prescribed steroid course. Will add H1 blocker (hydroxyzine) to help with patient's pruritis. He was made aware of the indications and side effects associated with this medication; aware that it may cause him to be somnolent. Encouraged use of calamine lotion to help soothe irritated skin.  Will add topical TAC 0.1% as well, as he is demonstrating little to no improvement with the systemic steroids, of which he is currently on day 6 of 12. Patient encouraged to avoid scratching areas in efforts to prevent transmission. Additionally, discussed washing clothing and linens in hot water to reduce risk of repeat contact. Patient to return call to the clinic tomorrow if not improving, at which time we may need to consider a change in his treatment plan all  together.   Discussed follow up with primary care physician in 1 week for re-evaluation. I have reviewed the follow up and strict return precautions for any new or worsening symptoms. Patient is aware of symptoms that would be deemed urgent/emergent, and would thus require further evaluation either here or in the emergency department. At the time of discharge, he verbalized understanding and consent with the discharge plan as it was reviewed with him. All questions were fielded by provider and/or clinic staff prior to patient discharge.    Final Clinical Impressions / Urgent Care Diagnoses:   Final diagnoses:  Contact dermatitis due to poison sumac    New Prescriptions:  [place] Controlled Substance Registry consulted? Not Applicable  Meds ordered this encounter  Medications   triamcinolone cream (KENALOG) 0.1 %    Sig: Apply 1 application topically 2 (two) times daily.    Dispense:  80 g    Refill:  0   hydrOXYzine (ATARAX/VISTARIL) 25 MG tablet    Sig: Take 1 tablet (25 mg total) by mouth every 8 (eight) hours as needed.    Dispense:  15 tablet    Refill:  0    Recommended Follow up Care:  Patient encouraged to follow up with the following provider within  the specified time frame, or sooner as dictated by the severity of his symptoms. As always, he was instructed that for any urgent/emergent care needs, he should seek care either here or in the emergency department for more immediate evaluation.  Follow-up Information    PCP In 1 week.   Why: General reassessment of symptoms if not improving        NOTE: This note was prepared using Scientist, clinical (histocompatibility and immunogenetics)Dragon dictation software along with smaller Lobbyistphrase technology. Despite my best ability to proofread, there is the potential that transcriptional errors may still occur from this process, and are completely unintentional.    Verlee MonteGray, Koehn Salehi E, NP 12/02/18 2114

## 2018-12-01 NOTE — Discharge Instructions (Addendum)
It was very nice seeing you today in clinic. Thank you for entrusting me with your care.   Continue oral steroids. Avoid topical Benadryl, as it can make symptoms worse. Add topical steroids TWICE a day for the next week. Take the hydroxyzine as needed for itching. Remember it can make you sleep, so be careful when working or driving.   Make arrangements to follow up with your regular doctor in 1 week for re-evaluation if not improving. If your symptoms/condition worsens, please seek follow up care either here or in the ER. Please remember, our [facility] providers are "right here with you" when you need [place].   Again, it was my pleasure to take care of you today. Thank you for choosing our clinic. I hope that you start to feel better quickly.   Honor Loh, MSN, APRN, FNP-C, CEN Advanced Practice Provider [facility]

## 2018-12-02 ENCOUNTER — Telehealth: Payer: Self-pay | Admitting: Emergency Medicine

## 2018-12-02 MED ORDER — TRIAMCINOLONE ACETONIDE 0.1 % EX CREA: 1.0000 "application " | TOPICAL_CREAM | Freq: Two times a day (BID) | CUTANEOUS | 2 refills | Status: DC

## 2018-12-02 NOTE — Telephone Encounter (Signed)
Patient called stating that he is running out of his cream that was prescribed for his poison ivy.  Honor Loh, NP wanted a refill of his cream sent to patient's pharmacy.  Patient was notified that the prescription was sent to [facility] in [place].  Patient verbalized understanding.

## 2019-03-06 ENCOUNTER — Ambulatory Visit
Admission: EM | Admit: 2019-03-06 | Discharge: 2019-03-06 | Disposition: A | Discharge status: Home / Self Care | Payer: Commercial Managed Care - PPO | Attending: Family Medicine | Admitting: Family Medicine

## 2019-03-06 ENCOUNTER — Other Ambulatory Visit: Payer: Self-pay

## 2019-03-06 ENCOUNTER — Encounter: Payer: Self-pay | Admitting: Emergency Medicine

## 2019-03-06 DIAGNOSIS — L509 Urticaria, unspecified: Secondary | ICD-10-CM | POA: Diagnosis not present

## 2019-03-06 MED ORDER — PREDNISONE 10 MG PO TABS: ORAL_TABLET | ORAL | 0 refills | Status: DC

## 2019-03-06 NOTE — ED Triage Notes (Signed)
Patient in today c/o itchy rash x 2 days. Patient states it is all over his body, but started on his buttocks and thighs.

## 2019-03-06 NOTE — Discharge Instructions (Addendum)
Over the counter antihistamines (zyrtec or claritin once daily) and Benadryl at night Cortisone 10 Skin moisturizer for dry skin

## 2019-03-11 NOTE — ED Provider Notes (Signed)
[facility]    CSN: [id number] Arrival date & time: 03/06/19  9518      History   Chief Complaint Chief Complaint  Patient presents with  . Rash    HPI Steven Clements is a 64 y.o. male.   64 yo male with a c/o rash on his body for 2 days. States rash is itchy. Has taken benadryl with temporary relief. Denies any swelling, wheezing, shortness of breath, fever, chills. No known sick contacts, new soaps or detergents or suspicious foods or other allergens.    Rash   Past Medical History:  Diagnosis Date  . Kidney stones     There are no problems to display for this patient.   Past Surgical History:  Procedure Laterality Date  . LITHOTRIPSY         Home Medications    Prior to Admission medications   Medication Sig Start Date End Date Taking? Authorizing Provider  hydrOXYzine (ATARAX/VISTARIL) 25 MG tablet Take 1 tablet (25 mg total) by mouth every 8 (eight) hours as needed. 12/01/18   Verlee Monte, NP  predniSONE (DELTASONE) 10 MG tablet Start 60 mg po day one, then 50 mg po day two, taper by 10 mg daily until complete. 03/06/19   Payton Mccallum, MD  triamcinolone cream (KENALOG) 0.1 % Apply 1 application topically 2 (two) times daily. 12/01/18   Verlee Monte, NP  triamcinolone cream (KENALOG) 0.1 % Apply 1 application topically 2 (two) times daily. 12/02/18   Verlee Monte, NP  albuterol (PROVENTIL HFA;VENTOLIN HFA) 108 (90 Base) MCG/ACT inhaler Inhale 2 puffs into the lungs every 4 (four) hours as needed. 01/22/16 11/26/18  Renford Dills, NP  fexofenadine (ALLEGRA) 180 MG tablet Take 1 tablet (180 mg total) by mouth daily. 01/29/16 11/26/18  Multani, Bhupinder, NP  fluticasone (FLONASE) 50 MCG/ACT nasal spray Place 1 spray into both nostrils daily. 01/29/16 11/26/18  Multani, Bhupinder, NP    Family History Family History  Problem Relation Age of Onset  . Hypertension Mother   . Atrial fibrillation Mother   . Cancer Father     Social  History Social History   Tobacco Use  . Smoking status: Former Smoker    Quit date: 03/06/1999    Years since quitting: 20.0  . Smokeless tobacco: Current User  Substance Use Topics  . Alcohol use: No  . Drug use: Never     Allergies   Oxycodone   Review of Systems Review of Systems  Skin: Positive for rash.     Physical Exam Triage Vital Signs ED Triage Vitals  Enc Vitals Group     BP 03/06/19 0838 110/80     Pulse Rate 03/06/19 0838 86     Resp 03/06/19 0838 18     Temp 03/06/19 0838 98 F (36.7 C)     Temp Source 03/06/19 0838 Oral     SpO2 03/06/19 0838 99 %     Weight 03/06/19 0839 256 lb (116.1 kg)     Height 03/06/19 0839 6\' 1"  (1.854 m)     Head Circumference --      Peak Flow --      Pain Score 03/06/19 0837 3     Pain Loc --      Pain Edu? --      Excl. in GC? --    No data found.  Updated Vital Signs BP 110/80 (BP Location: Left Arm)   Pulse 86   Temp 98 F (36.7  C) (Oral)   Resp 18   Ht 6\' 1"  (1.854 m)   Wt 116.1 kg   SpO2 99%   BMI 33.78 kg/m   Visual Acuity Right Eye Distance:   Left Eye Distance:   Bilateral Distance:    Right Eye Near:   Left Eye Near:    Bilateral Near:     Physical Exam Vitals and nursing note reviewed.  Skin:    Findings: Rash present. Rash is urticarial.      UC Treatments / Results  Labs (all labs ordered are listed, but only abnormal results are displayed) Labs Reviewed - No data to display  EKG   Radiology No results found.  Procedures Procedures (including critical care time)  Medications Ordered in UC Medications - No data to display  Initial Impression / Assessment and Plan / UC Course  I have reviewed the triage vital signs and the nursing notes.  Pertinent labs & imaging results that were available during my care of the patient were reviewed by me and considered in my medical decision making (see chart for details).     Final Clinical Impressions(s) / UC Diagnoses   Final  diagnoses:  Urticaria     Discharge Instructions     Over the counter antihistamines (zyrtec or claritin once daily) and Benadryl at night Cortisone 10 Skin moisturizer for dry skin    ED Prescriptions    Medication Sig Dispense Auth. Provider   predniSONE (DELTASONE) 10 MG tablet Start 60 mg po day one, then 50 mg po day two, taper by 10 mg daily until complete. 21 tablet Norval Gable, MD     1. diagnosis reviewed with patient 2. rx as per orders above; reviewed possible side effects, interactions, risks and benefits  3. Recommend supportive treatment as above 4. Follow-up prn if symptoms worsen or don't improve  PDMP not reviewed this encounter.   Norval Gable, MD 03/11/19 1233

## 2020-01-01 ENCOUNTER — Ambulatory Visit: Payer: Commercial Managed Care - PPO | Attending: Internal Medicine

## 2020-01-01 DIAGNOSIS — Z23 Encounter for immunization: Secondary | ICD-10-CM

## 2020-01-01 NOTE — Progress Notes (Signed)
   Covid-19 Vaccination Clinic  Name:  Jamesyn Lindell    MRN: [id number] DOB: 12-13-1955  01/01/2020  Mr. Skelton was observed post Covid-19 immunization for 15 minutes without incident. He was provided with Vaccine Information Sheet and instruction to access the V-Safe system.   Mr. Kia was instructed to call 911 with any severe reactions post vaccine: Marland Kitchen Difficulty breathing  . Swelling of face and throat  . A fast heartbeat  . A bad rash all over body  . Dizziness and weakness   Immunizations Administered    Name Date Dose VIS Date Route   Pfizer COVID-19 Vaccine 01/01/2020  1:48 PM 0.3 mL 11/08/2019 Intramuscular   Manufacturer: [employer], [employer]   Lot: [id number]   NDC: [address]-3

## 2020-06-21 ENCOUNTER — Ambulatory Visit (INDEPENDENT_AMBULATORY_CARE_PROVIDER_SITE_OTHER): Payer: Commercial Managed Care - PPO

## 2020-06-21 ENCOUNTER — Ambulatory Visit
Admission: EM | Admit: 2020-06-21 | Discharge: 2020-06-21 | Disposition: A | Discharge status: Home / Self Care | Payer: Commercial Managed Care - PPO | Attending: Emergency Medicine | Admitting: Emergency Medicine

## 2020-06-21 ENCOUNTER — Encounter: Payer: Self-pay | Admitting: Emergency Medicine

## 2020-06-21 ENCOUNTER — Other Ambulatory Visit: Payer: Self-pay

## 2020-06-21 DIAGNOSIS — W19XXXA Unspecified fall, initial encounter: Secondary | ICD-10-CM

## 2020-06-21 DIAGNOSIS — W182XXA Fall in (into) shower or empty bathtub, initial encounter: Secondary | ICD-10-CM | POA: Diagnosis not present

## 2020-06-21 DIAGNOSIS — M25512 Pain in left shoulder: Secondary | ICD-10-CM | POA: Diagnosis not present

## 2020-06-21 MED ORDER — IBUPROFEN 600 MG PO TABS: 600.0000 mg | ORAL_TABLET | Freq: Four times a day (QID) | ORAL | 0 refills | Status: DC | PRN

## 2020-06-21 NOTE — ED Provider Notes (Signed)
[facility]    CSN: [id number] Arrival date & time: 06/21/20  4098      History   Chief Complaint Chief Complaint  Patient presents with  . Fall  . Shoulder Pain    left    HPI Steven Clements is a 65 y.o. male.   Steven Clements presents with complaints of left shoulder pain s/p fall last night. He was sitting on the edge of his tub, slipped, and fell backwards out of the tub. He is uncertain if he landed on the shoulder or if his shoulder struck his vanity but had immediate shoulder and thoracic back pain. No previous shoulder pain. Pain with active movement, particularly with raising left arm. Occasional numbness sensation to dorsum of left hand. No neck pain and with full ROM of neck. Occasional, very specific elbow pain with particular movement, but otherwise no elbow pain swelling or bruising.     ROS per HPI, negative if not otherwise mentioned.      Past Medical History:  Diagnosis Date  . Kidney stones     There are no problems to display for this patient.   Past Surgical History:  Procedure Laterality Date  . LITHOTRIPSY         Home Medications    Prior to Admission medications   Medication Sig Start Date End Date Taking? Authorizing Provider  ibuprofen (ADVIL) 600 MG tablet Take 1 tablet (600 mg total) by mouth every 6 (six) hours as needed. 06/21/20  Yes Fabian Walder, Dorene Grebe B, NP  albuterol (PROVENTIL HFA;VENTOLIN HFA) 108 (90 Base) MCG/ACT inhaler Inhale 2 puffs into the lungs every 4 (four) hours as needed. 01/22/16 11/26/18  Renford Dills, NP  fexofenadine (ALLEGRA) 180 MG tablet Take 1 tablet (180 mg total) by mouth daily. 01/29/16 11/26/18  Multani, Bhupinder, NP  fluticasone (FLONASE) 50 MCG/ACT nasal spray Place 1 spray into both nostrils daily. 01/29/16 11/26/18  Multani, Bhupinder, NP    Family History Family History  Problem Relation Age of Onset  . Hypertension Mother   . Atrial fibrillation Mother   . Cancer  Father     Social History Social History   Tobacco Use  . Smoking status: Former Smoker    Quit date: 03/06/1999    Years since quitting: 21.3  . Smokeless tobacco: Current User  Vaping Use  . Vaping Use: Never used  Substance Use Topics  . Alcohol use: No  . Drug use: Never     Allergies   Oxycodone   Review of Systems Review of Systems   Physical Exam Triage Vital Signs ED Triage Vitals  Enc Vitals Group     BP 06/21/20 0832 130/79     Pulse Rate 06/21/20 0832 79     Resp 06/21/20 0832 16     Temp 06/21/20 0832 98.1 F (36.7 C)     Temp Source 06/21/20 0832 Oral     SpO2 06/21/20 0832 98 %     Weight 06/21/20 0830 221 lb (100.2 kg)     Height 06/21/20 0830 6\' 1"  (1.854 m)     Head Circumference --      Peak Flow --      Pain Score 06/21/20 0829 8     Pain Loc --      Pain Edu? --      Excl. in GC? --    No data found.  Updated Vital Signs BP 130/79 (BP Location: Right Arm)   Pulse 79   Temp  98.1 F (36.7 C) (Oral)   Resp 16   Ht 6\' 1"  (1.854 m)   Wt 221 lb (100.2 kg)   SpO2 98%   BMI 29.16 kg/m   Visual Acuity Right Eye Distance:   Left Eye Distance:   Bilateral Distance:    Right Eye Near:   Left Eye Near:    Bilateral Near:     Physical Exam Constitutional:      Appearance: He is well-developed.  Cardiovascular:     Rate and Rhythm: Normal rate.  Pulmonary:     Effort: Pulmonary effort is normal.  Musculoskeletal:     Left shoulder: Tenderness present. No swelling, deformity or bony tenderness. Decreased range of motion. Decreased strength. Normal pulse.     Left elbow: Normal.     Left forearm: Normal.     Left wrist: Normal.       Arms:     Comments: Left shoulder with weakness noted with ROM; pain and limitation to raising left arm actively; minimal pain with passive ROM however, no bony tenderness or abnormality but with anterior soft tissue tenderness and tenderness at [facility] joint of left shoulder; full ROM of left elbow and  without point tenderness on exam; cap refill < 2 seconds  And gross sensation intact to left hand  Skin:    General: Skin is warm and dry.  Neurological:     Mental Status: He is alert and oriented to person, place, and time.      UC Treatments / Results  Labs (all labs ordered are listed, but only abnormal results are displayed) Labs Reviewed - No data to display  EKG   Radiology DG Shoulder Left  Result Date: 06/21/2020 CLINICAL DATA:  65 year old male status post fall with pain. EXAM: LEFT SHOULDER - 2+ VIEW COMPARISON:  Left shoulder series 10/31/2013. FINDINGS: Bone mineralization is within normal limits for age. There is no evidence of fracture or dislocation. There is no evidence of arthropathy or other focal bone abnormality. Negative visible left ribs and chest. IMPRESSION: Negative. Electronically Signed   By: 11/02/2013 M.D.   On: 06/21/2020 09:18    Procedures Procedures (including critical care time)  Medications Ordered in UC Medications - No data to display  Initial Impression / Assessment and Plan / UC Course  I have reviewed the triage vital signs and the nursing notes.  Pertinent labs & imaging results that were available during my care of the patient were reviewed by me and considered in my medical decision making (see chart for details).     Xray without acute findings s/p fall last night. Pain and limitation to ROM of left shoulder with strain vs tear considered as well. Orthopedics follow up recommended with pain management discussed. Patient verbalized understanding and agreeable to plan.   Final Clinical Impressions(s) / UC Diagnoses   Final diagnoses:  Acute pain of left shoulder  Fall, initial encounter     Discharge Instructions     Your xray is normal today which is reassuring.  Tendon strain/ rupture is still considered given the amount of pain and injury you had.  I do recommend follow up with orthopedics, particularly if you are not  improving over the weekend.  Ice, activity as tolerated, including passive range of motion.  Ibuprofen as needed to help with pain.      ED Prescriptions    Medication Sig Dispense Auth. Provider   ibuprofen (ADVIL) 600 MG tablet Take 1 tablet (600 mg total)  by mouth every 6 (six) hours as needed. 30 tablet Georgetta Haber, NP     PDMP not reviewed this encounter.   Georgetta Haber, NP 06/21/20 [phone]

## 2020-06-21 NOTE — ED Triage Notes (Signed)
Patient states that he was sitting on the side of his bath tub and the towel slipped out from under him and he fell back on his left shoulder.  Patient c/o neck and left shoulder pain.  Patient denies LOC.

## 2020-06-21 NOTE — Discharge Instructions (Addendum)
Your xray is normal today which is reassuring.  Tendon strain/ rupture is still considered given the amount of pain and injury you had.  I do recommend follow up with orthopedics, particularly if you are not improving over the weekend.  Ice, activity as tolerated, including passive range of motion.  Ibuprofen as needed to help with pain.

## 2021-02-16 ENCOUNTER — Other Ambulatory Visit: Payer: Self-pay

## 2021-02-16 ENCOUNTER — Ambulatory Visit
Admission: RE | Admit: 2021-02-16 | Discharge: 2021-02-16 | Disposition: A | Discharge status: Home / Self Care | Payer: Commercial Managed Care - PPO | Source: Ambulatory Visit | Attending: Emergency Medicine | Admitting: Emergency Medicine

## 2021-02-16 VITALS — BP 133/69 | HR 79 | Temp 98.2°F | Resp 18 | Ht 73.0 in | Wt 230.0 lb

## 2021-02-16 DIAGNOSIS — L259 Unspecified contact dermatitis, unspecified cause: Secondary | ICD-10-CM | POA: Diagnosis not present

## 2021-02-16 MED ORDER — METHYLPREDNISOLONE 4 MG PO TBPK: ORAL_TABLET | ORAL | 0 refills | Status: DC

## 2021-02-16 NOTE — Discharge Instructions (Signed)
Take over-the-counter Allegra 180 mg daily or Zyrtec or Claritin 10 mg daily to help with your itching.  You can take over-the-counter Benadryl, 50 mg at bedtime, as needed for itching and sleep.  Take the prednisone pack according to the package instructions.  You will taken on tapering dose over a period of 6 days.  Take it with food and always take it first in the morning with breakfast.  Take over-the-counter Pepcid 20 mg twice daily to help with itching as well.  If your blood sugar elevates resume your Metformin while taking the Medrol.  If you develop any swelling of your lips or tongue, tightness in your throat, or difficulty breathing you need to go to the ER for evaluation.

## 2021-02-16 NOTE — ED Triage Notes (Signed)
Patient is here for "Rash". Spreading "but started on Left leg". Itchy/red/scaly. No fever. No exposure to anything/new/different.

## 2021-02-16 NOTE — ED Provider Notes (Signed)
[facility]    CSN: [id number] Arrival date & time: 02/16/21  8099      History   Chief Complaint Chief Complaint  Patient presents with   Rash    Appt: 9am.    HPI Steven Clements is a 66 y.o. male.   HPI  66 year old male here for evaluation of skin complaints.  Patient reports over the last 11 days he has been experiencing a red, scaly, itchy rash that started on the outside of his left knee and has spread since then.  Now he has lesions on his left calf on the front and side, the backs of both arms, upper chest, and across his back.  All of the lesions have a red base with scaly skin over top.  He is unaware of any plant contact as he has not been out of the woods.  He did stay at a condominium in [place] prior to the onset of the rash where he used a new soap.  He is unaware of any change in personal hygiene products and is not any change in medications.  Past Medical History:  Diagnosis Date   Kidney stones     There are no problems to display for this patient.   Past Surgical History:  Procedure Laterality Date   LITHOTRIPSY         Home Medications    Prior to Admission medications   Medication Sig Start Date End Date Taking? Authorizing Provider  methylPREDNISolone (MEDROL DOSEPAK) 4 MG TBPK tablet Take according to the package insert. 02/16/21  Yes Becky Augusta, NP  ibuprofen (ADVIL) 600 MG tablet Take 1 tablet (600 mg total) by mouth every 6 (six) hours as needed. 06/21/20   Georgetta Haber, NP  albuterol (PROVENTIL HFA;VENTOLIN HFA) 108 (90 Base) MCG/ACT inhaler Inhale 2 puffs into the lungs every 4 (four) hours as needed. 01/22/16 11/26/18  Renford Dills, NP  fexofenadine (ALLEGRA) 180 MG tablet Take 1 tablet (180 mg total) by mouth daily. 01/29/16 11/26/18  Multani, Bhupinder, NP  fluticasone (FLONASE) 50 MCG/ACT nasal spray Place 1 spray into both nostrils daily. 01/29/16 11/26/18  Multani, Bhupinder, NP    Family History Family  History  Problem Relation Age of Onset   Hypertension Mother    Atrial fibrillation Mother    Cancer Father     Social History Social History   Tobacco Use   Smoking status: Former    Types: Cigarettes    Quit date: 03/06/1999    Years since quitting: 21.9   Smokeless tobacco: Current  Vaping Use   Vaping Use: Never used  Substance Use Topics   Alcohol use: No   Drug use: Never     Allergies   Prednisone and Oxycodone   Review of Systems Review of Systems  Constitutional:  Negative for fever.  Skin:  Positive for color change and rash.  Hematological: Negative.   Psychiatric/Behavioral: Negative.      Physical Exam Triage Vital Signs ED Triage Vitals  Enc Vitals Group     BP 02/16/21 0854 133/69     Pulse Rate 02/16/21 0854 79     Resp 02/16/21 0854 18     Temp 02/16/21 0854 98.2 F (36.8 C)     Temp Source 02/16/21 0854 Oral     SpO2 02/16/21 0854 100 %     Weight 02/16/21 0856 230 lb (104.3 kg)     Height 02/16/21 0856 6\' 1"  (1.854 m)     Head  Circumference --      Peak Flow --      Pain Score 02/16/21 0853 0     Pain Loc --      Pain Edu? --      Excl. in GC? --    No data found.  Updated Vital Signs BP 133/69 (BP Location: Left Arm)    Pulse 79    Temp 98.2 F (36.8 C) (Oral)    Resp 18    Ht 6\' 1"  (1.854 m)    Wt 230 lb (104.3 kg)    SpO2 100%    BMI 30.34 kg/m   Visual Acuity Right Eye Distance:   Left Eye Distance:   Bilateral Distance:    Right Eye Near:   Left Eye Near:    Bilateral Near:     Physical Exam Vitals and nursing note reviewed.  Constitutional:      General: He is not in acute distress.    Appearance: Normal appearance. He is not ill-appearing.  Musculoskeletal:        General: No swelling.  Skin:    General: Skin is warm and dry.     Capillary Refill: Capillary refill takes less than 2 seconds.     Findings: Rash present. No erythema or lesion.  Neurological:     General: No focal deficit present.     Mental  Status: He is alert and oriented to person, place, and time.  Psychiatric:        Mood and Affect: Mood normal.        Behavior: Behavior normal.        Thought Content: Thought content normal.        Judgment: Judgment normal.     UC Treatments / Results  Labs (all labs ordered are listed, but only abnormal results are displayed) Labs Reviewed - No data to display  EKG   Radiology No results found.  Procedures Procedures (including critical care time)  Medications Ordered in UC Medications - No data to display  Initial Impression / Assessment and Plan / UC Course  I have reviewed the triage vital signs and the nursing notes.  Pertinent labs & imaging results that were available during my care of the patient were reviewed by me and considered in my medical decision making (see chart for details).  Patient is a nontoxic-appearing 66 year old male here for evaluation of a red, scaly, itchy rash that began on the outside of his left knee 11 days ago and has now spread to different parts of his body as outlined in HPI above.  Prior to the rash outbreak he states that he stayed in a [place]ium in [place] where [place] change the towels daily.  He did use a different soap that was in arm seizure [facility] and he does not use the substance.  The rash did not begin until after [place] from [place].  On exam patient has scaly patches of skin with erythematous base on the outside of his left knee, anterior and lateral aspect of the right calf, the backs of both arms, upper chest, and scattered around the back.  There is no central clearing.  No vesicles or pustules noted.  No erythema of the surrounding skin, induration, or fluctuance.  Suspect patient's contact dermatitis with an unknown trigger.  Patient has a history of hyperglycemia to a blood sugar of 600 with prednisone.  He states that he took this a couple of years ago  when he had a severe poison [place]  exposure that lasted 2 weeks before being treated.  At that time he was diagnosed as being diabetic and placed on glipizide and metformin.  His blood sugar rapidly returned to normal on glipizide.  He states that he improved his diet and exercise and has been taken off all of his diabetic medication.  I discussed that we can treat the patient with a Medrol Dosepak which does have the ability to elevate the blood sugar but not to the same extent as prednisone.  I have advised him to check his blood sugar and if he notices it start to climb he can resume his metformin, which she has at home, until after he completes the steroid pack.  Once he completes the steroids his blood sugar should return to normal.  I have also advised him to use over-the-counter Allegra, Claritin, or Zyrtec during the day as needed for itch and Benadryl at nighttime.  I have also advised him to take over-the-counter Pepcid 20 mg twice daily to help with itching.  Return precautions reviewed with patient.   Final Clinical Impressions(s) / UC Diagnoses   Final diagnoses:  Contact dermatitis, unspecified contact dermatitis type, unspecified trigger     Discharge Instructions      Take over-the-counter Allegra 180 mg daily or Zyrtec or Claritin 10 mg daily to help with your itching.  You can take over-the-counter Benadryl, 50 mg at bedtime, as needed for itching and sleep.  Take the prednisone pack according to the package instructions.  You will taken on tapering dose over a period of 6 days.  Take it with food and always take it first in the morning with breakfast.  Take over-the-counter Pepcid 20 mg twice daily to help with itching as well.  If your blood sugar elevates resume your Metformin while taking the Medrol.  If you develop any swelling of your lips or tongue, tightness in your throat, or difficulty breathing you need to go to the ER for evaluation.      ED Prescriptions     Medication Sig Dispense Auth.  Provider   methylPREDNISolone (MEDROL DOSEPAK) 4 MG TBPK tablet Take according to the package insert. 1 each Becky Augusta, NP      PDMP not reviewed this encounter.   Becky Augusta, NP 02/16/21 [phone]

## 2021-02-16 NOTE — ED Triage Notes (Signed)
Recent trip to [place], stayed in a condo, came back from that and this started, Did try a new soap there, not used it since being there.

## 2021-03-24 ENCOUNTER — Ambulatory Visit (INDEPENDENT_AMBULATORY_CARE_PROVIDER_SITE_OTHER): Payer: Medicare Other | Admitting: Internal Medicine

## 2021-03-24 VITALS — BP 142/103 | HR 126 | Resp 18 | Ht 73.0 in | Wt 242.0 lb

## 2021-03-24 DIAGNOSIS — G4733 Obstructive sleep apnea (adult) (pediatric): Secondary | ICD-10-CM

## 2021-03-24 DIAGNOSIS — E119 Type 2 diabetes mellitus without complications: Secondary | ICD-10-CM

## 2021-03-24 DIAGNOSIS — Z7189 Other specified counseling: Secondary | ICD-10-CM | POA: Diagnosis not present

## 2021-03-24 DIAGNOSIS — Z9989 Dependence on other enabling machines and devices: Secondary | ICD-10-CM

## 2021-03-24 DIAGNOSIS — E669 Obesity, unspecified: Secondary | ICD-10-CM | POA: Insufficient documentation

## 2021-03-24 NOTE — Patient Instructions (Signed)

## 2021-03-24 NOTE — Progress Notes (Signed)
[facility] ?[address] ?[place], [place] [address] ? ?Pulmonary Sleep Medicine  ? ?Office Visit Note ? ?Patient Name: Steven Clements ?DOB: [birth year] ?MRN [id number] ? ? ? ?Chief Complaint: Obstructive Sleep Apnea visit ? ?Brief History: ? ?Morio is seen today for initial consult to establish care and get face to face notes for a replacement unit. The patient has a 13 year history of sleep apnea. With original symptoms  snoring, 15lbs weight gain in one year, recurring kidney stones, sleepiness,  Patient said he is using PAP nightly and normal bedtime approx 1130pm, wake up 6 - 630 am.  Rarely gets up since he stopped drinking beer. The patient feels great after sleeping with PAP.  The patient reports benefiting from PAP use. Reported sleepiness is  resolved and the Epworth Sleepiness Score is 13 out of 24. The patient does not take naps. The patient complains of the following: needing new machine; current unit it 65 years old  The compliance download shows  compliance with an average use time of calculated 4.59 hours/per night.  hours. The AHI is not available.  The patient does not complain of limb movements disrupting sleep. ? ?ROS ? ?General: (-) fever, (-) chills, (-) night sweat ?Nose and Sinuses: (-) nasal stuffiness or itchiness, (-) postnasal drip, (-) nosebleeds, (-) sinus trouble. ?Mouth and Throat: (-) sore throat, (-) hoarseness. ?Neck: (-) swollen glands, (-) enlarged thyroid, (-) neck pain. ?Respiratory: - cough, - shortness of breath, - wheezing. ?Neurologic: - numbness, - tingling. ?Psychiatric: - anxiety, - depression ? ? ?Current Medication: ?Outpatient Encounter Medications as of 03/24/2021  ?Medication Sig  ? ibuprofen (ADVIL) 600 MG tablet Take 1 tablet (600 mg total) by mouth every 6 (six) hours as needed.  ? methylPREDNISolone (MEDROL DOSEPAK) 4 MG TBPK tablet Take according to the package insert.  ? [DISCONTINUED] albuterol (PROVENTIL HFA;VENTOLIN HFA) 108 (90  Base) MCG/ACT inhaler Inhale 2 puffs into the lungs every 4 (four) hours as needed.  ? [DISCONTINUED] fexofenadine (ALLEGRA) 180 MG tablet Take 1 tablet (180 mg total) by mouth daily.  ? [DISCONTINUED] fluticasone (FLONASE) 50 MCG/ACT nasal spray Place 1 spray into both nostrils daily.  ? ?No facility-administered encounter medications on file as of 03/24/2021.  ? ? ?Surgical History: ?Past Surgical History:  ?Procedure Laterality Date  ? LITHOTRIPSY    ? ? ?Medical History: ?Past Medical History:  ?Diagnosis Date  ? Kidney stones   ? ? ?Family History: ?Non contributory to the present illness ? ?Social History: ?Social History  ? ?Socioeconomic History  ? Marital status: Married  ?  Spouse name: Not on file  ? Number of children: Not on file  ? Years of education: Not on file  ? Highest education level: Not on file  ?Occupational History  ? Not on file  ?Tobacco Use  ? Smoking status: Former  ?  Types: Cigarettes  ?  Quit date: 03/06/1999  ?  Years since quitting: 22.0  ? Smokeless tobacco: Current  ?Vaping Use  ? Vaping Use: Never used  ?Substance and Sexual Activity  ? Alcohol use: No  ? Drug use: Never  ? Sexual activity: Not on file  ?Other Topics Concern  ? Not on file  ?Social History Narrative  ? Not on file  ? ?Social Determinants of Health  ? ?Financial Resource Strain: Not on file  ?Food Insecurity: Not on file  ?Transportation Needs: Not on file  ?Physical Activity: Not on file  ?Stress: Not on file  ?  Social Connections: Not on file  ?Intimate Partner Violence: Not on file  ? ? ?Vital Signs: ?There were no vitals taken for this visit. ?There is no height or weight on file to calculate BMI.  ? ? ?Examination: ?General Appearance: The patient is well-developed, well-nourished, and in no distress. ?Neck Circumference: 48.5CM ?Skin: Gross inspection of skin unremarkable. ?Head: normocephalic, no gross deformities. ?Eyes: no gross deformities noted. ?ENT: ears appear grossly normal ?Neurologic: Alert and  oriented. No involuntary movements. ? ? ? ?EPWORTH SLEEPINESS SCALE: ? ?Scale:  ?(0)= no chance of dozing; (1)= slight chance of dozing; (2)= moderate chance of dozing; (3)= high chance of dozing ? ?Chance  Situtation ?   ?Sitting and reading: 2 ?  ? Watching TV: 3 ?   ?Sitting Inactive in public: 1 ?   ?As a passenger in car: 3   ?   ?Lying down to rest: 2 ?   ?Sitting and talking: 0 ?   ?Sitting quielty after lunch: 2 ?   ?In a car, stopped in traffic: 0 ? ? ?TOTAL SCORE:   13 out of 24 ? ? ? ?SLEEP STUDIES: ? ?PSG 03/02/2008  - Overall AHI 55.3,  Min SpO2 74% ? ? ?CPAP COMPLIANCE DATA: ? ?Date Range: NOT DOWNLOADABLE, 13 YR -CALCULATED AVERAGE 3.95HOURS ? ? ?LABS: ?No results found for this or any previous visit (from the past 2160 hour(s)). ? ?Radiology: ?No results found. ? ?No results found. ? ?No results found. ? ? ? ?Assessment and Plan: ?There are no problems to display for this patient. ? ? ?1. OSA on CPAP ?The patient does tolerate PAP and reports  benefit from PAP use. The patient was reminded how to clean equipment and advised to replace supplies routinely. The patient was also counselled on weight loss. The compliance is unavailable. The AHI is unavailable. ? ? ?OSA- he will establish compliance with a loaner machine. We will see him back in 30d for a download.  ? ?2. CPAP use counseling ?CPAP Counseling: had a lengthy discussion with the patient regarding the importance of PAP therapy in management of the sleep apnea. Patient appears to understand the risk factor reduction and also understands the risks associated with untreated sleep apnea. Patient will try to make a good faith effort to remain compliant with therapy. Also instructed the patient on proper cleaning of the device including the water must be changed daily if possible and use of distilled water is preferred. Patient understands that the machine should be regularly cleaned with appropriate recommended cleaning solutions that do not damage  the PAP machine for example given white vinegar and water rinses. Other methods such as ozone treatment may not be as good as these simple methods to achieve cleaning.  ? ?3. Diabetes mellitus without complication (HCC) ?Controlled with diet and lifestyle changes. Continue.   ? ? ?General Counseling: I have discussed the findings of the evaluation and examination with Gelene Mink.  I have also discussed any further diagnostic evaluation thatmay be needed or ordered today. Arnoldo verbalizes understanding of the findings of todays visit. We also reviewed his medications today and discussed drug interactions and side effects including but not limited excessive drowsiness and altered mental states. We also discussed that there is always a risk not just to him but also people around him. he has been encouraged to call the office with any questions or concerns that should arise related to todays visit. ? ?No orders of the defined types were placed in this encounter. ?  ? ? ? ? ?  I have personally obtained a history, examined the patient, evaluated laboratory and imaging results, formulated the assessment and plan and placed orders. ?This patient was seen today by Emmaline Kluver, PA-C in collaboration with Dr. Freda Munro.  ? ?Yevonne Pax, MD FCCP ?Diplomate ABMS Pulmonary Critical Care Medicine and Sleep Medicine ? ?

## 2021-03-27 ENCOUNTER — Encounter: Payer: Self-pay | Admitting: Emergency Medicine

## 2021-03-27 ENCOUNTER — Ambulatory Visit
Admission: EM | Admit: 2021-03-27 | Discharge: 2021-03-27 | Disposition: A | Discharge status: Home / Self Care | Payer: Medicare Other | Attending: Physician Assistant | Admitting: Physician Assistant

## 2021-03-27 ENCOUNTER — Other Ambulatory Visit: Payer: Self-pay

## 2021-03-27 ENCOUNTER — Telehealth: Payer: Self-pay | Admitting: Physician Assistant

## 2021-03-27 DIAGNOSIS — U071 COVID-19: Secondary | ICD-10-CM | POA: Insufficient documentation

## 2021-03-27 DIAGNOSIS — R051 Acute cough: Secondary | ICD-10-CM | POA: Diagnosis not present

## 2021-03-27 DIAGNOSIS — R0602 Shortness of breath: Secondary | ICD-10-CM | POA: Diagnosis not present

## 2021-03-27 DIAGNOSIS — R52 Pain, unspecified: Secondary | ICD-10-CM | POA: Diagnosis not present

## 2021-03-27 HISTORY — DX: Type 2 diabetes mellitus without complications: E11.9

## 2021-03-27 LAB — RESP PANEL BY RT-PCR (FLU A&B, COVID) ARPGX2
Influenza A by PCR: NEGATIVE
Influenza B by PCR: NEGATIVE
SARS Coronavirus 2 by RT PCR: POSITIVE — AB

## 2021-03-27 MED ORDER — ALBUTEROL SULFATE HFA 108 (90 BASE) MCG/ACT IN AERS: 1.0000 | INHALATION_SPRAY | Freq: Four times a day (QID) | RESPIRATORY_TRACT | 0 refills | Status: AC | PRN

## 2021-03-27 MED ORDER — PROMETHAZINE-DM 6.25-15 MG/5ML PO SYRP: 5.0000 mL | ORAL_SOLUTION | Freq: Four times a day (QID) | ORAL | 0 refills | Status: DC | PRN

## 2021-03-27 MED ORDER — MOLNUPIRAVIR 200 MG PO CAPS: 4.0000 | ORAL_CAPSULE | Freq: Two times a day (BID) | ORAL | 0 refills | Status: AC

## 2021-03-27 NOTE — Discharge Instructions (Signed)
-  We have tested you for flu and COVID and the result be back shortly.  We will call with results.  Since you had a faint positive at home test, even with an expired test, there is still high suspicion for COVID-19 given your symptoms.  If you are positive for COVID-19 we can discuss antiviral therapy.  You will need to isolate 5 days from when your symptoms started and wear a mask for 5 days. ?- Increase rest and fluids.  Over-the-counter cough medications, Tylenol/Motrin for aches and pains. ?

## 2021-03-27 NOTE — ED Provider Notes (Signed)
?[facility] ? ? ? ?CSN: [id number] ?Arrival date & time: 03/27/21  [id number] ? ? ?  ? ?History   ?Chief Complaint ?Chief Complaint  ?Patient presents with  ? Cough  ? ? ?HPI ?Steven Clements is a 66 y.o. male presenting for onset of fatigue, body aches, headaches, sinus congestion and drainage, dry cough and shortness of breath when coughing.  Symptom onset was yesterday.  Patient has felt feverish but not recorded the temperature and has been taking ibuprofen and Tylenol for fever control.  He did take a COVID test at home last night but says the test was expired.  He is unsure of how expired the test is.  He says there was a very faint line and he would like to be retested today.  No sick contacts or known exposure to COVID-19.  Past medical history is negative for diabetes which is apparently very well controlled and obstructive sleep apnea.  Patient also reports history of wheezing and shortness of breath during allergy season at times.  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  He has no other concerns. ? ?HPI ? ?Past Medical History:  ?Diagnosis Date  ? Diabetes mellitus without complication (HCC)   ? Kidney stones   ? ? ?Patient Active Problem List  ? Diagnosis Date Noted  ? OSA on CPAP 03/24/2021  ? CPAP use counseling 03/24/2021  ? Obesity 03/24/2021  ? Diabetes mellitus without complication (HCC) 03/24/2021  ? ? ?Past Surgical History:  ?Procedure Laterality Date  ? LITHOTRIPSY    ? ? ? ? ? ?Home Medications   ? ?Prior to Admission medications   ?Medication Sig Start Date End Date Taking? Authorizing Provider  ?albuterol (VENTOLIN HFA) 108 (90 Base) MCG/ACT inhaler Inhale 1-2 puffs into the lungs every 6 (six) hours as needed for wheezing or shortness of breath. 03/27/21   Eusebio FriendlyEaves, Tyja Gortney B, PA-C  ?ibuprofen (ADVIL) 600 MG tablet Take 1 tablet (600 mg total) by mouth every 6 (six) hours as needed. 06/21/20   Georgetta HaberBurky, Natalie B, NP  ?molnupiravir EUA (LAGEVRIO) 200 MG CAPS capsule Take 4 capsules (800 mg  total) by mouth 2 (two) times daily for 5 days. 03/27/21 04/01/21  Shirlee LatchEaves, Jorden Mahl B, PA-C  ?promethazine-dextromethorphan (PROMETHAZINE-DM) 6.25-15 MG/5ML syrup Take 5 mLs by mouth 4 (four) times daily as needed. 03/27/21   Shirlee LatchEaves, Aesha Agrawal B, PA-C  ?sildenafil (VIAGRA) 50 MG tablet Take by mouth. 03/17/21 04/16/21  [provider]  ?fexofenadine (ALLEGRA) 180 MG tablet Take 1 tablet (180 mg total) by mouth daily. 01/29/16 03/24/21  Multani, Bhupinder, NP  ?fluticasone (FLONASE) 50 MCG/ACT nasal spray Place 1 spray into both nostrils daily. 01/29/16 03/24/21  Multani, Bhupinder, NP  ? ? ?Family History ?Family History  ?Problem Relation Age of Onset  ? Hypertension Mother   ? Atrial fibrillation Mother   ? Cancer Father   ? ? ?Social History ?Social History  ? ?Tobacco Use  ? Smoking status: Former  ?  Types: Cigarettes  ?  Quit date: 03/06/1999  ?  Years since quitting: 22.0  ? Smokeless tobacco: Current  ?Vaping Use  ? Vaping Use: Never used  ?Substance Use Topics  ? Alcohol use: Yes  ? Drug use: Never  ? ? ? ?Allergies   ?Prednisone, Oxycodone, and Statins ? ? ?Review of Systems ?Review of Systems  ?Constitutional:  Positive for fatigue and fever.  ?HENT:  Positive for congestion, postnasal drip and rhinorrhea. Negative for sinus pressure, sinus pain and  sore throat.   ?Respiratory:  Positive for cough and shortness of breath. Negative for wheezing.   ?Cardiovascular:  Negative for chest pain.  ?Gastrointestinal:  Negative for abdominal pain, diarrhea, nausea and vomiting.  ?Musculoskeletal:  Positive for myalgias.  ?Neurological:  Positive for headaches. Negative for weakness and light-headedness.  ?Hematological:  Negative for adenopathy.  ? ? ?Physical Exam ?Triage Vital Signs ?ED Triage Vitals [03/27/21 0942]  ?Enc Vitals Group  ?   BP   ?   Pulse   ?   Resp   ?   Temp   ?   Temp src   ?   SpO2   ?   Weight   ?   Height   ?   Head Circumference   ?   Peak Flow   ?   Pain Score 5  ?   Pain Loc   ?   Pain Edu?   ?    Excl. in GC?   ? ?No data found. ? ?Updated Vital Signs ?BP 124/71 (BP Location: Right Arm)   Pulse 95   Temp 99.5 ?F (37.5 ?C) (Oral)   Resp (!) 22   SpO2 100%  ?   ? ?Physical Exam ?Vitals and nursing note reviewed.  ?Constitutional:   ?   General: He is not in acute distress. ?   Appearance: Normal appearance. He is well-developed. He is ill-appearing.  ?HENT:  ?   Head: Normocephalic and atraumatic.  ?   Nose: Congestion present.  ?   Mouth/Throat:  ?   Mouth: Mucous membranes are moist.  ?   Pharynx: Oropharynx is clear. Posterior oropharyngeal erythema (mild with clear PND) present.  ?Eyes:  ?   General: No scleral icterus. ?   Conjunctiva/sclera: Conjunctivae normal.  ?Cardiovascular:  ?   Rate and Rhythm: Normal rate and regular rhythm.  ?   Heart sounds: Normal heart sounds.  ?Pulmonary:  ?   Effort: Pulmonary effort is normal. No respiratory distress.  ?   Breath sounds: Normal breath sounds. No wheezing, rhonchi or rales.  ?Musculoskeletal:  ?   Cervical back: Neck supple.  ?Skin: ?   General: Skin is warm and dry.  ?   Capillary Refill: Capillary refill takes less than 2 seconds.  ?Neurological:  ?   General: No focal deficit present.  ?   Mental Status: He is alert. Mental status is at baseline.  ?   Motor: No weakness.  ?   Coordination: Coordination normal.  ?   Gait: Gait normal.  ?Psychiatric:     ?   Mood and Affect: Mood normal.     ?   Behavior: Behavior normal.     ?   Thought Content: Thought content normal.  ? ? ? ?UC Treatments / Results  ?Labs ?(all labs ordered are listed, but only abnormal results are displayed) ?Labs Reviewed  ?RESP PANEL BY RT-PCR (FLU A&B, COVID) ARPGX2 - Abnormal; Notable for the following components:  ?    Result Value  ? SARS Coronavirus 2 by RT PCR POSITIVE (*)   ? All other components within normal limits  ? ? ?EKG ? ? ?Radiology ?No results found. ? ?Procedures ?Procedures (including critical care time) ? ?Medications Ordered in UC ?Medications - No data to  display ? ?Initial Impression / Assessment and Plan / UC Course  ?I have reviewed the triage vital signs and the nursing notes. ? ?Pertinent labs & imaging results that were available during my care  of the patient were reviewed by me and considered in my medical decision making (see chart for details). ? ?66 year old male presenting for fatigue, body aches, headaches, cough, congestion and drainage.  Vitals normal and stable.  Patient mildly ill-appearing but nontoxic.  Has nasal congestion and posterior pharyngeal erythema with clear drainage.  Chest clear to auscultation and heart regular rate and rhythm.  No respiratory distress. ? ?Respiratory panel obtained.  Positive COVID.  Discussed results with patient.  Meets guidelines based on his age.  Sent Molnupiravir as well as Promethazine DM and albuterol.  Encouraged him to increase rest and fluids.  Reviewed current CDC guidelines, isolation protocol and ED precautions for COVID-19.  Follow-up as needed. ? ? ?Final Clinical Impressions(s) / UC Diagnoses  ? ?Final diagnoses:  ?Acute cough  ?Body aches  ?COVID-19  ?Shortness of breath  ? ? ? ?Discharge Instructions   ? ?  ?-We have tested you for flu and COVID and the result be back shortly.  We will call with results.  Since you had a faint positive at home test, even with an expired test, there is still high suspicion for COVID-19 given your symptoms.  If you are positive for COVID-19 we can discuss antiviral therapy.  You will need to isolate 5 days from when your symptoms started and wear a mask for 5 days. ?- Increase rest and fluids.  Over-the-counter cough medications, Tylenol/Motrin for aches and pains. ? ? ? ? ?ED Prescriptions   ?None ?  ? ?PDMP not reviewed this encounter. ?  ?Shirlee Latch, PA-C ?03/27/21 1239 ? ?

## 2021-03-27 NOTE — Telephone Encounter (Signed)
Patient with positive COVID test.  Sent Molnupiravir, Promethazine DM and albuterol.  Discussed results with him and reviewed current CDC guidelines. ?

## 2021-03-27 NOTE — ED Triage Notes (Signed)
Expired covid test taken last night and faint positive line read by patient.   ? ?Patient reports symptoms started yesterday.  Headache, sinus drainage, lump in throat.  Has a cough that takes his breath away.   ?

## 2021-05-05 ENCOUNTER — Ambulatory Visit (INDEPENDENT_AMBULATORY_CARE_PROVIDER_SITE_OTHER): Payer: Medicare HMO | Admitting: Internal Medicine

## 2021-05-05 VITALS — BP 119/63 | HR 73 | Resp 16 | Ht 73.0 in | Wt 232.0 lb

## 2021-05-05 DIAGNOSIS — Z7189 Other specified counseling: Secondary | ICD-10-CM | POA: Diagnosis not present

## 2021-05-05 DIAGNOSIS — G4733 Obstructive sleep apnea (adult) (pediatric): Secondary | ICD-10-CM

## 2021-05-05 DIAGNOSIS — Z9989 Dependence on other enabling machines and devices: Secondary | ICD-10-CM

## 2021-05-05 NOTE — Patient Instructions (Signed)

## 2021-05-05 NOTE — Progress Notes (Signed)
[facility] ?[address] ?[place], [place] [address] ? ?Pulmonary Sleep Medicine  ? ?Office Visit Note ? ?Patient Name: Steven Clements ?DOB: [birth year] ?MRN [id number] ? ? ? ?Chief Complaint: Obstructive Sleep Apnea visit ? ?Brief History: ? ?Elber is seen today for face to face note to follow up on replacement machine. The patient has a 13 year history of sleep apnea. Patient is using PAP nightly with nasal pillows.  The patient feels great after sleeping with PAP.  The patient reports benefiting from PAP use. Reported sleepiness is  improved and the Epworth Sleepiness Score is 15 out of 24. He mows lawns every day and feels that his sleepiness is related to his activity. The patient does take naps. The patient complains of the following: no problems  The compliance download shows  compliance with an average use time of 7:28 hours @ 100%. The AHI is 0.3  The patient does not complain of limb movements disrupting sleep. ? ?ROS ? ?General: (-) fever, (-) chills, (-) night sweat ?Nose and Sinuses: (-) nasal stuffiness or itchiness, (-) postnasal drip, (-) nosebleeds, (-) sinus trouble. ?Mouth and Throat: (-) sore throat, (-) hoarseness. ?Neck: (-) swollen glands, (-) enlarged thyroid, (-) neck pain. ?Respiratory: - cough, - shortness of breath, - wheezing. ?Neurologic: - numbness, - tingling. ?Psychiatric: - anxiety, - depression ? ? ?Current Medication: ?Outpatient Encounter Medications as of 05/05/2021  ?Medication Sig  ? albuterol (VENTOLIN HFA) 108 (90 Base) MCG/ACT inhaler Inhale 1-2 puffs into the lungs every 6 (six) hours as needed for wheezing or shortness of breath.  ? ibuprofen (ADVIL) 600 MG tablet Take 1 tablet (600 mg total) by mouth every 6 (six) hours as needed.  ? sildenafil (VIAGRA) 50 MG tablet Take by mouth.  ? [DISCONTINUED] fexofenadine (ALLEGRA) 180 MG tablet Take 1 tablet (180 mg total) by mouth daily.  ? [DISCONTINUED] fluticasone (FLONASE) 50 MCG/ACT nasal spray Place  1 spray into both nostrils daily.  ? [DISCONTINUED] promethazine-dextromethorphan (PROMETHAZINE-DM) 6.25-15 MG/5ML syrup Take 5 mLs by mouth 4 (four) times daily as needed.  ? ?No facility-administered encounter medications on file as of 05/05/2021.  ? ? ?Surgical History: ?Past Surgical History:  ?Procedure Laterality Date  ? LITHOTRIPSY    ? ? ?Medical History: ?Past Medical History:  ?Diagnosis Date  ? Diabetes mellitus without complication ([place])   ? Kidney stones   ? ? ?Family History: ?Non contributory to the present illness ? ?Social History: ?Social History  ? ?Socioeconomic History  ? Marital status: Married  ?  Spouse name: Not on file  ? Number of children: Not on file  ? Years of education: Not on file  ? Highest education level: Not on file  ?Occupational History  ? Not on file  ?Tobacco Use  ? Smoking status: Former  ?  Types: Cigarettes  ?  Quit date: 03/06/1999  ?  Years since quitting: 22.1  ? Smokeless tobacco: Current  ?Vaping Use  ? Vaping Use: Never used  ?Substance and Sexual Activity  ? Alcohol use: Yes  ? Drug use: Never  ? Sexual activity: Not on file  ?Other Topics Concern  ? Not on file  ?Social History Narrative  ? Not on file  ? ?Social Determinants of Health  ? ?Financial Resource Strain: Not on file  ?Food Insecurity: Not on file  ?Transportation Needs: Not on file  ?Physical Activity: Not on file  ?Stress: Not on file  ?Social Connections: Not on file  ?Intimate Partner Violence:  Not on file  ? ? ?Vital Signs: ?Blood pressure 119/63, pulse 73, resp. rate 16, height 6\' 1"  (1.854 m), weight 232 lb (105.2 kg), SpO2 95 %. ?Body mass index is 30.61 kg/m?.  ? ? ?Examination: ?General Appearance: The patient is well-developed, well-nourished, and in no distress. ?Neck Circumference: 48 cm ?Skin: Gross inspection of skin unremarkable. ?Head: normocephalic, no gross deformities. ?Eyes: no gross deformities noted. ?ENT: ears appear grossly normal ?Neurologic: Alert and oriented. No involuntary  movements. ? ? ? ?EPWORTH SLEEPINESS SCALE: ? ?Scale:  ?(0)= no chance of dozing; (1)= slight chance of dozing; (2)= moderate chance of dozing; (3)= high chance of dozing ? ?Chance  Situtation ?   ?Sitting and reading: 2 ?  ? Watching TV: 3 ?   ?Sitting Inactive in public: 2 ?   ?As a passenger in car: 3   ?   ?Lying down to rest: 3 ?   ?Sitting and talking: 0 ?   ?Sitting quielty after lunch: 2 ?   ?In a car, stopped in traffic: 0 ? ? ?TOTAL SCORE:   15 out of 24 ? ? ? ?SLEEP STUDIES: ? ?PSG 03/02/2008  - Overall AHI 55.3,  Min SpO2 74% ? ? ?CPAP COMPLIANCE DATA: ? ?Date Range: 04/02/21 - 05/01/21 ? ?Average Daily Use: 7:28 hours ? ?Median Use: 7:19 hours ? ?Compliance for > 4 Hours: 100% ? ?AHI: 0.3 respiratory events per hour ? ?Days Used: 30/30 ? ?Mask Leak: 11.2 lpm ? ?95th Percentile Pressure: 8cmH ? ? ? ? ? ? ? ? ?LABS: ?Recent Results (from the past 2160 hour(s))  ?Resp Panel by RT-PCR (Flu A&B, Covid) Nasopharyngeal Swab     Status: Abnormal  ? Collection Time: 03/27/21 10:28 AM  ? Specimen: Nasopharyngeal Swab; Nasopharyngeal(NP) swabs in vial transport medium  ?Result Value Ref Range  ? SARS Coronavirus 2 by RT PCR POSITIVE (A) NEGATIVE  ?  Comment: (NOTE) ?SARS-CoV-2 target nucleic acids are DETECTED. ? ?The SARS-CoV-2 RNA is generally detectable in upper respiratory ?specimens during the acute phase of infection. Positive results are ?indicative of the presence of the identified virus, but do not rule ?out bacterial infection or co-infection with other pathogens not ?detected by the test. Clinical correlation with patient history and ?other diagnostic information is necessary to determine patient ?infection status. The expected result is Negative. ? ?Fact Sheet for Patients: ?EntrepreneurPulse.com.au ? ?Fact Sheet for Healthcare Providers: ?IncredibleEmployment.be ? ?This test is not yet approved or cleared by the [place] FDA and  ?has been authorized for detection  and/or diagnosis of SARS-CoV-2 by ?FDA under an Emergency Use Authorization (EUA).  This EUA will ?remain in effect (meaning this test can be used) for the duration of  ?the COVID-19 declaration under Section 564(b)(1) of the A ct, 21 ?U.S.C. section 360bbb-3(b)(1), unless the authorization is ?terminated or revoked sooner. ? ?  ? Influenza A by PCR NEGATIVE NEGATIVE  ? Influenza B by PCR NEGATIVE NEGATIVE  ?  Comment: (NOTE) ?The Xpert Xpress SARS-CoV-2/FLU/RSV plus assay is intended as an aid ?in the diagnosis of influenza from Nasopharyngeal swab specimens and ?should not be used as a sole basis for treatment. Nasal washings and ?aspirates are unacceptable for Xpert Xpress SARS-CoV-2/FLU/RSV ?testing. ? ?Fact Sheet for Patients: ?EntrepreneurPulse.com.au ? ?Fact Sheet for Healthcare Providers: ?IncredibleEmployment.be ? ?This test is not yet approved or cleared by the [place] FDA and ?has been authorized for detection and/or diagnosis of SARS-CoV-2 by ?FDA under an Emergency Use Authorization (EUA). This EUA  will remain ?in effect (meaning this test can be used) for the duration of the ?COVID-19 declaration under Section 564(b)(1) of the Act, 21 U.S.C. ?section 360bbb-3(b)(1), unless the authorization is terminated or ?revoked. ? ?Performed at [facility], [address] ?[place], [place] [address] ?  ? ? ?Radiology: ?No results found. ? ?No results found. ? ?No results found. ? ? ? ?Assessment and Plan: ?Patient Active Problem List  ? Diagnosis Date Noted  ? OSA on CPAP 03/24/2021  ? CPAP use counseling 03/24/2021  ? Obesity 03/24/2021  ? Diabetes mellitus without complication ([place]) [id number]  ? ? ?1. OSA on CPAP ?The patient does tolerate PAP and reports  benefit from PAP use. Machine has passed end of life and must be replaced. He is compliant with his existing machine. The patient was reminded how to clean equipment and advised to replace supplies  routinely. The patient was also counselled on weight loss. The compliance is excellent. The AHI is 0.3. ? ? ?OSA- replace machine. F/u 30d after setup.  ? ? ?2. CPAP use counseling ?CPAP Counseling: had a le

## 2021-08-04 ENCOUNTER — Ambulatory Visit (INDEPENDENT_AMBULATORY_CARE_PROVIDER_SITE_OTHER): Payer: Medicare HMO | Admitting: Internal Medicine

## 2021-08-04 VITALS — BP 123/76 | HR 106 | Resp 16 | Ht 73.0 in | Wt 242.0 lb

## 2021-08-04 DIAGNOSIS — G4733 Obstructive sleep apnea (adult) (pediatric): Secondary | ICD-10-CM | POA: Diagnosis not present

## 2021-08-04 DIAGNOSIS — Z7189 Other specified counseling: Secondary | ICD-10-CM

## 2021-08-04 DIAGNOSIS — Z9989 Dependence on other enabling machines and devices: Secondary | ICD-10-CM

## 2021-08-04 NOTE — Progress Notes (Signed)
[facility] [address] [place], [place] [address]  Pulmonary Sleep Medicine   Office Visit Note  Patient Name: Steven Clements DOB: 28-Aug-1955 MRN [id number]    Chief Complaint: Obstructive Sleep Apnea visit  Brief History:  Steven Clements is seen today for a follow up visit for CPAP@ 8 cmH2O. The patient has a 13 year history of sleep apnea. Patient is using PAP nightly.  The patient feels rested after sleeping with PAP.  The patient reports benefiting from PAP use. Reported sleepiness is  improved and the Epworth Sleepiness Score is 2 out of 24. The patient continues to require PAP therapy in order to eliminate his sleep apnea. The patient does not take naps. The patient complains of the following: no complaints  The compliance download shows 100% compliance with an average use time of 6 hours 51 minutes. The AHI is 0.3.  The patient does not complain of limb movements disrupting sleep.  ROS  General: (-) fever, (-) chills, (-) night sweat Nose and Sinuses: (-) nasal stuffiness or itchiness, (-) postnasal drip, (-) nosebleeds, (-) sinus trouble. Mouth and Throat: (-) sore throat, (-) hoarseness. Neck: (-) swollen glands, (-) enlarged thyroid, (-) neck pain. Respiratory: - cough, - shortness of breath, - wheezing. Neurologic: - numbness, - tingling. Psychiatric: - anxiety, - depression   Current Medication: Outpatient Encounter Medications as of 08/04/2021  Medication Sig   albuterol (VENTOLIN HFA) 108 (90 Base) MCG/ACT inhaler Inhale 1-2 puffs into the lungs every 6 (six) hours as needed for wheezing or shortness of breath.   ibuprofen (ADVIL) 600 MG tablet Take 1 tablet (600 mg total) by mouth every 6 (six) hours as needed.   sildenafil (VIAGRA) 50 MG tablet Take by mouth.   [DISCONTINUED] fexofenadine (ALLEGRA) 180 MG tablet Take 1 tablet (180 mg total) by mouth daily.   [DISCONTINUED] fluticasone (FLONASE) 50 MCG/ACT nasal spray Place 1 spray into both  nostrils daily.   No facility-administered encounter medications on file as of 08/04/2021.    Surgical History: Past Surgical History:  Procedure Laterality Date   LITHOTRIPSY      Medical History: Past Medical History:  Diagnosis Date   Diabetes mellitus without complication (H[facility])    Kidney stones     Family History: Non contributory to the present illness  Social History: Social History   Socioeconomic History   Marital status: Married    Spouse name: Not on file   Number of children: Not on file   Years of education: Not on file   Highest education level: Not on file  Occupational History   Not on file  Tobacco Use   Smoking status: Former    Types: Cigarettes    Quit date: 03/06/1999    Years since quitting: 22.4   Smokeless tobacco: Current  Vaping Use   Vaping Use: Never used  Substance and Sexual Activity   Alcohol use: Yes   Drug use: Never   Sexual activity: Not on file  Other Topics Concern   Not on file  Social History Narrative   Not on file   Social Determinants of Health   Financial Resource Strain: Not on file  Food Insecurity: Not on file  Transportation Needs: Not on file  Physical Activity: Not on file  Stress: Not on file  Social Connections: Not on file  Intimate Partner Violence: Not on file    Vital Signs: There were no vitals taken for this visit. There is no height or weight on file  to calculate BMI.    Examination: General Appearance: The patient is well-developed, well-nourished, and in no distress. Neck Circumference: 48 cm Skin: Gross inspection of skin unremarkable. Head: normocephalic, no gross deformities. Eyes: no gross deformities noted. ENT: ears appear grossly normal Neurologic: Alert and oriented. No involuntary movements.    EPWORTH SLEEPINESS SCALE:  Scale:  (0)= no chance of dozing; (1)= slight chance of dozing; (2)= moderate chance of dozing; (3)= high chance of dozing  Chance  Situtation     Sitting and reading: 1    Watching TV: 0    Sitting Inactive in public: 0    As a passenger in car: 0      Lying down to rest: 1    Sitting and talking: 0    Sitting quielty after lunch: 0    In a car, stopped in traffic: 0   TOTAL SCORE:   2 out of 24    SLEEP STUDIES:  PSG (02/2008) AHI 55.3/hr, min SpO2 74% Titration (02/2008) CPAP@  8 cmH2O   CPAP COMPLIANCE DATA:  Date Range: 07/02/2021-07/31/2021  Average Daily Use: 6 hours 51 minutes  Median Use: 6 hours 59 minutes  Compliance for > 4 Hours: 100%  AHI: 0.3 respiratory events per hour  Days Used: 30/30 days  Mask Leak: 3.1  95th Percentile Pressure: 8         LABS: No results found for this or any previous visit (from the past 2160 hour(s)).  Radiology: No results found.  No results found.  No results found.    Assessment and Plan: Patient Active Problem List   Diagnosis Date Noted   OSA on CPAP 03/24/2021   CPAP use counseling 03/24/2021   Obesity 03/24/2021   Diabetes mellitus without complication (HCC) 03/24/2021   1. OSA on CPAP The patient does tolerate PAP and reports  benefit from PAP use. The patient was reminded how to clean equipment and advised to replace supplies routinely. The patient was also counselled on weight loss. The compliance is excellent. The AHI is 0.3.   OSA on cpap- CPAP continues to be medically necessary to treat this patient's OSA.  Continue with excellent compliance. He has found it helpful to add a ramp as the opening pressure was a bit uncomfortable, we did discuss reducing pressure but he is happy with how he feels so we will make no changes now. F/u one year.    2. CPAP use counseling CPAP Counseling: had a lengthy discussion with the patient regarding the importance of PAP therapy in management of the sleep apnea. Patient appears to understand the risk factor reduction and also understands the risks associated with untreated sleep apnea. Patient  will try to make a good faith effort to remain compliant with therapy. Also instructed the patient on proper cleaning of the device including the water must be changed daily if possible and use of distilled water is preferred. Patient understands that the machine should be regularly cleaned with appropriate recommended cleaning solutions that do not damage the PAP machine for example given white vinegar and water rinses. Other methods such as ozone treatment may not be as good as these simple methods to achieve cleaning.     General Counseling: I have discussed the findings of the evaluation and examination with Gelene Mink.  I have also discussed any further diagnostic evaluation thatmay be needed or ordered today. Audric verbalizes understanding of the findings of todays visit. We also reviewed his medications today and discussed drug interactions  and side effects including but not limited excessive drowsiness and altered mental states. We also discussed that there is always a risk not just to him but also people around him. he has been encouraged to call the office with any questions or concerns that should arise related to todays visit.  No orders of the defined types were placed in this encounter.       I have personally obtained a history, examined the patient, evaluated laboratory and imaging results, formulated the assessment and plan and placed orders. This patient was seen today by Emmaline Kluver, PA-C in collaboration with Dr. Freda Munro.   Yevonne Pax, MD [facility] Diplomate ABMS Pulmonary Critical Care Medicine and Sleep Medicine

## 2021-08-04 NOTE — Patient Instructions (Signed)

## 2022-03-17 ENCOUNTER — Ambulatory Visit
Admission: EM | Admit: 2022-03-17 | Discharge: 2022-03-17 | Disposition: A | Discharge status: Home / Self Care | Payer: Medicare HMO | Attending: Emergency Medicine | Admitting: Emergency Medicine

## 2022-03-17 DIAGNOSIS — H6991 Unspecified Eustachian tube disorder, right ear: Secondary | ICD-10-CM | POA: Diagnosis not present

## 2022-03-17 MED ORDER — IPRATROPIUM BROMIDE 0.03 % NA SOLN: 2.0000 | Freq: Two times a day (BID) | NASAL | 12 refills | Status: DC

## 2022-03-17 MED ORDER — AZITHROMYCIN 250 MG PO TABS: 250.0000 mg | ORAL_TABLET | Freq: Every day | ORAL | 0 refills | Status: DC

## 2022-03-17 NOTE — Discharge Instructions (Signed)
Eustachian tube dysfunction refers to a condition in which a blockage develops in the narrow passage that connects the middle ear to the back of the nose (eustachian tube). The eustachian tube regulates air pressure in the middle ear by letting air move between the ear and nose. It also helps to drain fluid from the middle ear space.Eustachian tube dysfunction can affect one or both ears. When the eustachian tube does not function properly, air pressure, fluid, or both can build up in the middle ear.  Begin azithromycin to ensure the bacteria is aiding to your symptoms  Begin ipratropium nasal spray every morning and every evening to help clear out the sinuses which should help alleviate her pain  May use Tylenol or Motrin every 6 hours as needed as well as a warm compresses to the external ear   For congestion: take a daily anti-histamine like Zyrtec, Claritin, and a oral decongestant, such as pseudoephedrine.    It is important to stay hydrated: drink plenty of fluids (water, gatorade/powerade/pedialyte, juices, or teas) to keep your throat moisturized and help further relieve irritation/discomfort.   May follow-up with urgent care as needed symptoms persist or worsen

## 2022-03-17 NOTE — ED Provider Notes (Signed)
[facility]    CSN: [id number] Arrival date & time: 03/17/22  1118      History   Chief Complaint Chief Complaint  Patient presents with   loss of hearing    HPI Steven Clements is a 67 y.o. male.   Patient presents for evaluation of decreased hearing and a buzzing sensation to the right ear present for 2 days.  Endorses current nasal congestion and bilateral ear popping that has been occurring intermittent due [place].  Has not attempted treatment.  Denies ear drainage, pruritus, fevers.    Past Medical History:  Diagnosis Date   Diabetes mellitus without complication ([place])    Kidney stones     Patient Active Problem List   Diagnosis Date Noted   OSA on CPAP 03/24/2021   CPAP use counseling 03/24/2021   Obesity 03/24/2021   Diabetes mellitus without complication ([place]) [id number]    Past Surgical History:  Procedure Laterality Date   LITHOTRIPSY         Home Medications    Prior to Admission medications   Medication Sig Start Date End Date Taking? Authorizing Provider  albuterol (VENTOLIN HFA) 108 (90 Base) MCG/ACT inhaler Inhale 1-2 puffs into the lungs every 6 (six) hours as needed for wheezing or shortness of breath. 03/27/21   Danton Clap, PA-C  ibuprofen (ADVIL) 600 MG tablet Take 1 tablet (600 mg total) by mouth every 6 (six) hours as needed. 06/21/20   Zigmund Gottron, NP  sildenafil (VIAGRA) 50 MG tablet Take by mouth. 03/17/21 04/16/21  [provider]  fexofenadine (ALLEGRA) 180 MG tablet Take 1 tablet (180 mg total) by mouth daily. 01/29/16 03/24/21  Multani, Bhupinder, MD  fluticasone (FLONASE) 50 MCG/ACT nasal spray Place 1 spray into both nostrils daily. 01/29/16 03/24/21  Teola Bradley, MD    Family History Family History  Problem Relation Age of Onset   Hypertension Mother    Atrial fibrillation Mother    Cancer Father     Social History Social History   Tobacco Use   Smoking  status: Former    Types: Cigarettes    Quit date: 03/06/1999    Years since quitting: 23.0   Smokeless tobacco: Current  Vaping Use   Vaping Use: Never used  Substance Use Topics   Alcohol use: Yes   Drug use: Never     Allergies   Prednisone, Oxycodone, and Statins   Review of Systems Review of Systems   Physical Exam Triage Vital Signs ED Triage Vitals [03/17/22 1134]  Enc Vitals Group     BP 118/75     Pulse Rate 83     Resp 18     Temp 98.3 F (36.8 C)     Temp Source Oral     SpO2 95 %     Weight      Height      Head Circumference      Peak Flow      Pain Score 0     Pain Loc      Pain Edu?      Excl. in [place]?    No data found.  Updated Vital Signs BP 118/75 (BP Location: Left Arm)   Pulse 83   Temp 98.3 F (36.8 C) (Oral)   Resp 18   SpO2 95%   Visual Acuity Right Eye Distance:   Left Eye Distance:   Bilateral Distance:    Right Eye Near:  Left Eye Near:    Bilateral Near:     Physical Exam Constitutional:      Appearance: Normal appearance.  HENT:     Right Ear: Hearing, ear canal and external ear normal. A middle ear effusion is present.     Left Ear: Hearing, tympanic membrane, ear canal and external ear normal.  Eyes:     Extraocular Movements: Extraocular movements intact.  Pulmonary:     Effort: Pulmonary effort is normal.  Neurological:     Mental Status: He is alert and oriented to person, place, and time.      UC Treatments / Results  Labs (all labs ordered are listed, but only abnormal results are displayed) Labs Reviewed - No data to display  EKG   Radiology No results found.  Procedures Procedures (including critical care time)  Medications Ordered in UC Medications - No data to display  Initial Impression / Assessment and Plan / UC Course  I have reviewed the triage vital signs and the nursing notes.  Pertinent labs & imaging results that were available during my care of the patient were reviewed by me  and considered in my medical decision making (see chart for details).  Eustachian tube dysfunction, right  Middle ear effusion noted on exam, discussed with patient, most likely related to congestion, prescribed azithromycin as congestion and ear popping has been going on for 3 weeks and prescribed ipratropium nasal spray for further abortive care, recommended additional suggestions for management of congestion, may follow-up with his urgent care as needed if symptoms persist or worsen Final Clinical Impressions(s) / UC Diagnoses   Final diagnoses:  None   Discharge Instructions   None    ED Prescriptions   None    PDMP not reviewed this encounter.   Hans Eden, NP 03/17/22 1204

## 2022-03-17 NOTE — ED Triage Notes (Signed)
Pt presents with c/o not being able to hear out of R ear x 3 days.  States he hears ringing in the ear.

## 2022-03-18 ENCOUNTER — Ambulatory Visit: Payer: Self-pay

## 2022-04-10 ENCOUNTER — Encounter: Payer: Self-pay | Admitting: *Deleted

## 2022-04-13 ENCOUNTER — Encounter
Admission: RE | Disposition: A | Discharge status: Home / Self Care | Payer: Self-pay | Source: Home / Self Care | Attending: Gastroenterology

## 2022-04-13 ENCOUNTER — Ambulatory Visit: Payer: Medicare HMO | Admitting: Certified Registered Nurse Anesthetist

## 2022-04-13 ENCOUNTER — Ambulatory Visit
Admission: RE | Admit: 2022-04-13 | Discharge: 2022-04-13 | Disposition: A | Discharge status: Home / Self Care | Payer: Medicare HMO | Attending: Gastroenterology | Admitting: Gastroenterology

## 2022-04-13 DIAGNOSIS — Z800 Family history of malignant neoplasm of digestive organs: Secondary | ICD-10-CM | POA: Diagnosis not present

## 2022-04-13 DIAGNOSIS — K635 Polyp of colon: Secondary | ICD-10-CM | POA: Diagnosis not present

## 2022-04-13 DIAGNOSIS — Z1211 Encounter for screening for malignant neoplasm of colon: Secondary | ICD-10-CM | POA: Diagnosis present

## 2022-04-13 DIAGNOSIS — K640 First degree hemorrhoids: Secondary | ICD-10-CM | POA: Diagnosis not present

## 2022-04-13 DIAGNOSIS — E119 Type 2 diabetes mellitus without complications: Secondary | ICD-10-CM | POA: Diagnosis not present

## 2022-04-13 DIAGNOSIS — G4730 Sleep apnea, unspecified: Secondary | ICD-10-CM | POA: Diagnosis not present

## 2022-04-13 DIAGNOSIS — D123 Benign neoplasm of transverse colon: Secondary | ICD-10-CM | POA: Diagnosis not present

## 2022-04-13 HISTORY — DX: Personal history of urinary calculi: Z87.442

## 2022-04-13 HISTORY — PX: COLONOSCOPY WITH PROPOFOL: SHX5780

## 2022-04-13 LAB — GLUCOSE, CAPILLARY: Glucose-Capillary: 178 mg/dL — ABNORMAL HIGH (ref 70–99)

## 2022-04-13 SURGERY — COLONOSCOPY WITH PROPOFOL
Anesthesia: General

## 2022-04-13 MED ORDER — PROPOFOL 500 MG/50ML IV EMUL
INTRAVENOUS | Status: DC | PRN
  Administered 2022-04-13: 140 ug/kg/min via INTRAVENOUS

## 2022-04-13 MED ORDER — PROPOFOL 10 MG/ML IV BOLUS
INTRAVENOUS | Status: DC | PRN
  Administered 2022-04-13: 40 mg via INTRAVENOUS

## 2022-04-13 MED ORDER — SODIUM CHLORIDE 0.9 % IV SOLN
INTRAVENOUS | Status: DC
  Administered 2022-04-13: 20 mL/h via INTRAVENOUS

## 2022-04-13 MED ORDER — PHENYLEPHRINE 80 MCG/ML (10ML) SYRINGE FOR IV PUSH (FOR BLOOD PRESSURE SUPPORT)
PREFILLED_SYRINGE | INTRAVENOUS | Status: DC | PRN
  Administered 2022-04-13 (×6): 80 ug via INTRAVENOUS

## 2022-04-13 MED ORDER — PROPOFOL 1000 MG/100ML IV EMUL
INTRAVENOUS | Status: AC
  Filled 2022-04-13: qty 200

## 2022-04-13 MED ORDER — LIDOCAINE HCL (CARDIAC) PF 100 MG/5ML IV SOSY
PREFILLED_SYRINGE | INTRAVENOUS | Status: DC | PRN
  Administered 2022-04-13: 50 mg via INTRAVENOUS

## 2022-04-13 NOTE — Transfer of Care (Signed)
Immediate Anesthesia Transfer of Care Note  Patient: Steven Clements  Procedure(s) Performed: COLONOSCOPY WITH PROPOFOL  Patient Location: PACU  Anesthesia Type:General  Level of Consciousness: drowsy  Airway & Oxygen Therapy: Patient Spontanous Breathing  Post-op Assessment: Report given to RN and Post -op Vital signs reviewed and stable  Post vital signs: Reviewed and stable  Last Vitals:  Vitals Value Taken Time  BP 79/60 04/13/22 0807  Temp 36.6 C 04/13/22 0806  Pulse 88 04/13/22 0808  Resp 18 04/13/22 0808  SpO2 96 % 04/13/22 0808  Vitals shown include unvalidated device data.  Last Pain:  Vitals:   04/13/22 0806  TempSrc: Tympanic  PainSc: Asleep         Complications: No notable events documented.

## 2022-04-13 NOTE — H&P (Signed)
Outpatient short stay form Pre-procedure 04/13/2022  Lesly Rubenstein, MD  Primary Physician: Patient, No Pcp Per  Reason for visit:  Screening  History of present illness:    67 y/o gentleman with history of DM here for screening colonoscopy. Last colonoscopy was over 10 years ago. No blood thinners. Sister had stomach cancer that had metastasized from another location. No abdominal surgeries.    Current Facility-Administered Medications:    0.9 %  sodium chloride infusion, , Intravenous, Continuous, Areyana Leoni, Hilton Cork, MD, Last Rate: 20 mL/hr at 04/13/22 0725, 20 mL/hr at 04/13/22 0725  Medications Prior to Admission  Medication Sig Dispense Refill Last Dose   albuterol (VENTOLIN HFA) 108 (90 Base) MCG/ACT inhaler Inhale 1-2 puffs into the lungs every 6 (six) hours as needed for wheezing or shortness of breath. 1 g 0 Past Week   azithromycin (ZITHROMAX) 250 MG tablet Take 1 tablet (250 mg total) by mouth daily. Take first 2 tablets together, then 1 every day until finished. 6 tablet 0 Past Week   ibuprofen (ADVIL) 600 MG tablet Take 1 tablet (600 mg total) by mouth every 6 (six) hours as needed. 30 tablet 0 Past Week   ipratropium (ATROVENT) 0.03 % nasal spray Place 2 sprays into both nostrils every 12 (twelve) hours. 30 mL 12 Past Week   sildenafil (VIAGRA) 50 MG tablet Take by mouth.        Allergies  Allergen Reactions   Prednisone Other (See Comments)    Hyperglycemia   Oxycodone Other (See Comments)    Other Reaction: PERSONALITY CHANGE Other reaction(s): Other (See Comments) Other Reaction: PERSONALITY CHANGE    Statins Other (See Comments)     Past Medical History:  Diagnosis Date   Diabetes mellitus without complication ([place])    History of kidney stones     Review of systems:  Otherwise negative.    Physical Exam  Gen: Alert, oriented. Appears stated age.  HEENT: PERRLA. Lungs: No respiratory distress CV: RRR Abd: soft, benign, no masses Ext: No  edema    Planned procedures: Proceed with colonoscopy. The patient understands the nature of the planned procedure, indications, risks, alternatives and potential complications including but not limited to bleeding, infection, perforation, damage to internal organs and possible oversedation/side effects from anesthesia. The patient agrees and gives consent to proceed.  Please refer to procedure notes for findings, recommendations and patient disposition/instructions.     Lesly Rubenstein, MD [facility] Gastroenterology

## 2022-04-13 NOTE — Anesthesia Postprocedure Evaluation (Signed)
Anesthesia Post Note  Patient: Steven Clements  Procedure(s) Performed: COLONOSCOPY WITH PROPOFOL  Patient location during evaluation: Endoscopy Anesthesia Type: General Level of consciousness: awake and alert Pain management: pain level controlled Vital Signs Assessment: post-procedure vital signs reviewed and stable Respiratory status: spontaneous breathing, nonlabored ventilation, respiratory function stable and patient connected to nasal cannula oxygen Cardiovascular status: blood pressure returned to baseline and stable Postop Assessment: no apparent nausea or vomiting Anesthetic complications: no Comments: Patient appears to be sensitive to propofol. Hypotension improved in postop. I informed patient to continue telling future anesthesiologists about his sensitivity to propofol.   No notable events documented.   Last Vitals:  Vitals:   04/13/22 0806 04/13/22 0816  BP: (!) 79/60 (!) 83/48  Pulse: 91 99  Resp: 15 19  Temp: 36.6 C   SpO2: 95% 99%    Last Pain:  Vitals:   04/13/22 0816  TempSrc:   PainSc: 0-No pain                 Arita Miss

## 2022-04-13 NOTE — Interval H&P Note (Signed)
History and Physical Interval Note:  04/13/2022 7:43 AM  Steven Clements  has presented today for surgery, with the diagnosis of colon cancer screening.  The various methods of treatment have been discussed with the patient and family. After consideration of risks, benefits and other options for treatment, the patient has consented to  Procedure(s): COLONOSCOPY WITH PROPOFOL (N/A) as a surgical intervention.  The patient's history has been reviewed, patient examined, no change in status, stable for surgery.  I have reviewed the patient's chart and labs.  Questions were answered to the patient's satisfaction.     Lesly Rubenstein  Ok to proceed with colonoscopy

## 2022-04-13 NOTE — Anesthesia Preprocedure Evaluation (Signed)
Anesthesia Evaluation  Patient identified by MRN, date of birth, ID band Patient awake    Reviewed: Allergy & Precautions, NPO status , Patient's Chart, lab work & pertinent test results  History of Anesthesia Complications Negative for: history of anesthetic complications  Airway Mallampati: III  TM Distance: >3 FB Neck ROM: Full    Dental no notable dental hx. (+) Teeth Intact   Pulmonary sleep apnea and Continuous Positive Airway Pressure Ventilation , neg COPD, Patient abstained from smoking.Not current smoker, former smoker   Pulmonary exam normal breath sounds clear to auscultation       Cardiovascular Exercise Tolerance: Good METS(-) hypertension(-) CAD and (-) Past MI negative cardio ROS (-) dysrhythmias  Rhythm:Regular Rate:Normal - Systolic murmurs    Neuro/Psych negative neurological ROS  negative psych ROS   GI/Hepatic ,neg GERD  ,,(+)     (-) substance abuse    Endo/Other  neg diabetes  Diabetes diagnosed after large dose steroid pack. Has not had issues or been on diabetes meds since then  Renal/GU negative Renal ROS     Musculoskeletal   Abdominal   Peds  Hematology   Anesthesia Other Findings Past Medical History: No date: Diabetes mellitus without complication ([place]) No date: History of kidney stones  Reproductive/Obstetrics                             Anesthesia Physical Anesthesia Plan  ASA: 2  Anesthesia Plan: General   Post-op Pain Management: Minimal or no pain anticipated   Induction: Intravenous  PONV Risk Score and Plan: 2 and Propofol infusion, TIVA and Ondansetron  Airway Management Planned: Nasal Cannula  Additional Equipment: None  Intra-op Plan:   Post-operative Plan:   Informed Consent: I have reviewed the patients History and Physical, chart, labs and discussed the procedure including the risks, benefits and alternatives for the proposed  anesthesia with the patient or authorized representative who has indicated his/her understanding and acceptance.     Dental advisory given  Plan Discussed with: CRNA and Surgeon  Anesthesia Plan Comments: (Discussed risks of anesthesia with patient, including possibility of difficulty with spontaneous ventilation under anesthesia necessitating airway intervention, PONV, and rare risks such as cardiac or respiratory or neurological events, and allergic reactions. Discussed the role of CRNA in patient's perioperative care. Patient understands.)       Anesthesia Quick Evaluation

## 2022-04-13 NOTE — Op Note (Signed)
[facility] Gastroenterology Patient Name: Steven Clements Procedure Date: 04/13/2022 7:17 AM MRN: [id number] Account #: [id number] Date of Birth: 01/28/1955 Admit Type: Outpatient Age: 67 Room: [facility] ENDO ROOM 3 Gender: Male Note Status: Finalized Instrument Name: Park Meo [id number] Procedure:             Colonoscopy Indications:           Screening for colorectal malignant neoplasm Providers:             Andrey Farmer MD, MD Referring MD:          No Local Md, MD (Referring MD) Medicines:             Monitored Anesthesia Care Complications:         No immediate complications. Estimated blood loss:                         Minimal. Procedure:             Pre-Anesthesia Assessment:                        - Prior to the procedure, a History and Physical was                         performed, and patient medications and allergies were                         reviewed. The patient is competent. The risks and                         benefits of the procedure and the sedation options and                         risks were discussed with the patient. All questions                         were answered and informed consent was obtained.                         Patient identification and proposed procedure were                         verified by the physician, the nurse, the                         anesthesiologist, the anesthetist and the technician                         in the endoscopy suite. Mental Status Examination:                         alert and oriented. Airway Examination: normal                         oropharyngeal airway and neck mobility. Respiratory                         Examination: clear to auscultation. CV Examination:  normal. Prophylactic Antibiotics: The patient does not                         require prophylactic antibiotics. Prior                         Anticoagulants: The patient has taken no anticoagulant                          or antiplatelet agents. ASA Grade Assessment: II - A                         patient with mild systemic disease. After reviewing                         the risks and benefits, the patient was deemed in                         satisfactory condition to undergo the procedure. The                         anesthesia plan was to use monitored anesthesia care                         (MAC). Immediately prior to administration of                         medications, the patient was re-assessed for adequacy                         to receive sedatives. The heart rate, respiratory                         rate, oxygen saturations, blood pressure, adequacy of                         pulmonary ventilation, and response to care were                         monitored throughout the procedure. The physical                         status of the patient was re-assessed after the                         procedure.                        After obtaining informed consent, the colonoscope was                         passed under direct vision. Throughout the procedure,                         the patient's blood pressure, pulse, and oxygen                         saturations were monitored continuously. The  Colonoscope was introduced through the anus and                         advanced to the the cecum, identified by appendiceal                         orifice and ileocecal valve. The colonoscopy was                         performed without difficulty. The patient tolerated                         the procedure well. The quality of the bowel                         preparation was good. The ileocecal valve, appendiceal                         orifice, and rectum were photographed. Findings:      The perianal and digital rectal examinations were normal.      Two sessile polyps were found in the proximal transverse colon. The       polyps were 2 to 3 mm in size. These  polyps were removed with a cold       snare. Resection and retrieval were complete. Estimated blood loss was       minimal.      A 3 mm polyp was found in the descending colon. The polyp was sessile.       The polyp was removed with a cold snare. Resection and retrieval were       complete. Estimated blood loss was minimal.      Internal hemorrhoids were found during retroflexion. The hemorrhoids       were Grade I (internal hemorrhoids that do not prolapse).      The exam was otherwise without abnormality on direct and retroflexion       views. Impression:            - Two 2 to 3 mm polyps in the proximal transverse                         colon, removed with a cold snare. Resected and                         retrieved.                        - One 3 mm polyp in the descending colon, removed with                         a cold snare. Resected and retrieved.                        - Internal hemorrhoids.                        - The examination was otherwise normal on direct and                         retroflexion views. Recommendation:        -  Discharge patient to home.                        - Resume previous diet.                        - Continue present medications.                        - Await pathology results.                        - Repeat colonoscopy for surveillance based on                         pathology results.                        - Return to referring physician as previously                         scheduled. Procedure Code(s):     --- Professional ---                        [phone], Colonoscopy, flexible; with removal of                         tumor(s), polyp(s), or other lesion(s) by snare                         technique Diagnosis Code(s):     --- Professional ---                        Z12.11, Encounter for screening for malignant neoplasm                         of colon                        D12.3, Benign neoplasm of transverse colon (hepatic                          flexure or splenic flexure)                        D12.4, Benign neoplasm of descending colon                        K64.0, First degree hemorrhoids CPT copyright 2022 American Medical Association. All rights reserved. The codes documented in this report are preliminary and upon coder review may  be revised to meet current compliance requirements. Andrey Farmer MD, MD 04/13/2022 8:07:12 AM Number of Addenda: 0 Note Initiated On: 04/13/2022 7:17 AM Scope Withdrawal Time: 0 hours 9 minutes 14 seconds  Total Procedure Duration: 0 hours 13 minutes 50 seconds  Estimated Blood Loss:  Estimated blood loss was minimal.      [facility]

## 2022-04-14 ENCOUNTER — Encounter: Payer: Self-pay | Admitting: Gastroenterology

## 2022-04-14 LAB — SURGICAL PATHOLOGY

## 2022-04-24 ENCOUNTER — Encounter: Payer: Self-pay | Admitting: Gastroenterology

## 2022-08-28 NOTE — Progress Notes (Unsigned)
[facility] [address] [place], [place] [address]  Pulmonary Sleep Medicine   Office Visit Note  Patient Name: Steven Clements DOB: Jul 16, 1955 MRN [id number]    Chief Complaint: Obstructive Sleep Apnea visit  Brief History:  Steven Clements is seen today for an annual follow up visit for CPAP@ 8 cmH2O. The patient has a 14 year history of sleep apnea. Patient is using PAP nightly.  The patient feels rested after sleeping with PAP.  The patient reports benefit  from PAP use. Reported sleepiness is improved and the Epworth Sleepiness Score is 2 out of 24. The patient does not  take naps. The patient complains of the following: no complaints, needs supplies. The compliance download shows 99 % compliance with an average use time of 7 hours 23 minutes. The AHI is 0.2. The patient does not complain of limb movements disrupting sleep. The patient continues to require PAP therapy in order to eliminate sleep apnea.  ROS  General: (-) fever, (-) chills, (-) night sweat Nose and Sinuses: (-) nasal stuffiness or itchiness, (-) postnasal drip, (-) nosebleeds, (-) sinus trouble. Mouth and Throat: (-) sore throat, (-) hoarseness. Neck: (-) swollen glands, (-) enlarged thyroid, (-) neck pain. Respiratory: - cough, - shortness of breath, - wheezing. Neurologic: - numbness, - tingling. Psychiatric: - anxiety, - depression   Current Medication: Outpatient Encounter Medications as of 08/31/2022  Medication Sig   albuterol (VENTOLIN HFA) 108 (90 Base) MCG/ACT inhaler Inhale 1-2 puffs into the lungs every 6 (six) hours as needed for wheezing or shortness of breath.   ibuprofen (ADVIL) 600 MG tablet Take 1 tablet (600 mg total) by mouth every 6 (six) hours as needed.   ipratropium (ATROVENT) 0.03 % nasal spray Place 2 sprays into both nostrils every 12 (twelve) hours.   sildenafil (VIAGRA) 50 MG tablet Take by mouth.   [DISCONTINUED] azithromycin (ZITHROMAX) 250 MG tablet Take 1 tablet  (250 mg total) by mouth daily. Take first 2 tablets together, then 1 every day until finished.   [DISCONTINUED] fexofenadine (ALLEGRA) 180 MG tablet Take 1 tablet (180 mg total) by mouth daily.   [DISCONTINUED] fluticasone (FLONASE) 50 MCG/ACT nasal spray Place 1 spray into both nostrils daily.   No facility-administered encounter medications on file as of 08/31/2022.    Surgical History: Past Surgical History:  Procedure Laterality Date   COLONOSCOPY WITH PROPOFOL N/A 04/13/2022   Procedure: COLONOSCOPY WITH PROPOFOL;  Surgeon: Regis Bill, MD;  Location: [facility] ENDOSCOPY;  Service: Endoscopy;  Laterality: N/A;   LITHOTRIPSY      Medical History: Past Medical History:  Diagnosis Date   Diabetes mellitus without complication (HCC)    History of kidney stones     Family History: Non contributory to the present illness  Social History: Social History   Socioeconomic History   Marital status: Married    Spouse name: Not on file   Number of children: Not on file   Years of education: Not on file   Highest education level: Not on file  Occupational History   Not on file  Tobacco Use   Smoking status: Former    Current packs/day: 0.00    Types: Cigarettes    Quit date: 03/06/1999    Years since quitting: 23.5   Smokeless tobacco: Current  Vaping Use   Vaping status: Never Used  Substance and Sexual Activity   Alcohol use: Yes   Drug use: Never   Sexual activity: Not on file  Other Topics Concern  Not on file  Social History Narrative   Not on file   Social Determinants of Health   Financial Resource Strain: Not on file  Food Insecurity: Not on file  Transportation Needs: Not on file  Physical Activity: Not on file  Stress: Not on file  Social Connections: Not on file  Intimate Partner Violence: Not on file    Vital Signs: Blood pressure 116/67, pulse 99, resp. rate 12, height 6\' 1"  (1.854 m), weight 238 lb 8 oz (108.2 kg), SpO2 98%. Body mass index is  31.47 kg/m.    Examination: General Appearance: The patient is well-developed, well-nourished, and in no distress. Neck Circumference: *** Skin: Gross inspection of skin unremarkable. Head: normocephalic, no gross deformities. Eyes: no gross deformities noted. ENT: ears appear grossly normal Neurologic: Alert and oriented. No involuntary movements.  STOP BANG RISK ASSESSMENT S (snore) Have you been told that you snore?     No   T (tired) Are you often tired, fatigued, or sleepy during the day?   NO  O (obstruction) Do you stop breathing, choke, or gasp during sleep? NO   P (pressure) Do you have or are you being treated for high blood pressure? NO   B (BMI) Is your body index greater than 35 kg/m? NO   A (age) Are you 16 years old or older? YES   N (neck) Do you have a neck circumference greater than 16 inches?   YES   G (gender) Are you a male? YES   TOTAL STOP/BANG "YES" ANSWERS 3       A STOP-Bang score of 2 or less is considered low risk, and a score of 5 or more is high risk for having either moderate or severe OSA. For people who score 3 or 4, doctors may need to perform further assessment to determine how likely they are to have OSA.         EPWORTH SLEEPINESS SCALE:  Scale:  (0)= no chance of dozing; (1)= slight chance of dozing; (2)= moderate chance of dozing; (3)= high chance of dozing  Chance  Situtation    Sitting and reading: 1    Watching TV: 0    Sitting Inactive in public: 0    As a passenger in car: 1      Lying down to rest: 1    Sitting and talking: 0    Sitting quielty after lunch: 0    In a car, stopped in traffic: 0   TOTAL SCORE:   2 out of 24    SLEEP STUDIES:  PSG (02/10) AHI 55, min SP02 74% TITRATION (02/10) CPAP at 8 cmh20   CPAP COMPLIANCE DATA:  Date Range: 08/26/2021-08/25/2022  Average Daily Use: 7 hours 23 minutes  Median Use: 7 hours 24 minutes  Compliance for > 4 Hours: 99%  AHI: 0.2 respiratory events  per hour  Days Used: 363/365 days  Mask Leak: 9.7  95th Percentile Pressure: 8         LABS: No results found for this or any previous visit (from the past 2160 hour(s)).  Radiology: No results found.  No results found.  No results found.    Assessment and Plan: Patient Active Problem List   Diagnosis Date Noted   OSA on CPAP 03/24/2021   CPAP use counseling 03/24/2021   Obesity 03/24/2021   Diabetes mellitus without complication (HCC) 03/24/2021   1. OSA on CPAP The patient does tolerate PAP and reports  benefit from PAP  use. The patient was reminded how to clean equipment and advised to replace supplies routinely. The patient was also counselled on weight loss. The compliance is excellent. The AHI is 0.2.   OSA on cpap- controlled. Continue with excellent compliance with pap. CPAP continues to be medically necessary to treat this patient's OSA. F/u one year.    2. CPAP use counseling CPAP Counseling: had a lengthy discussion with the patient regarding the importance of PAP therapy in management of the sleep apnea. Patient appears to understand the risk factor reduction and also understands the risks associated with untreated sleep apnea. Patient will try to make a good faith effort to remain compliant with therapy. Also instructed the patient on proper cleaning of the device including the water must be changed daily if possible and use of distilled water is preferred. Patient understands that the machine should be regularly cleaned with appropriate recommended cleaning solutions that do not damage the PAP machine for example given white vinegar and water rinses. Other methods such as ozone treatment may not be as good as these simple methods to achieve cleaning.      General Counseling: I have discussed the findings of the evaluation and examination with Gelene Mink.  I have also discussed any further diagnostic evaluation thatmay be needed or ordered today. Lincon  verbalizes understanding of the findings of todays visit. We also reviewed his medications today and discussed drug interactions and side effects including but not limited excessive drowsiness and altered mental states. We also discussed that there is always a risk not just to him but also people around him. he has been encouraged to call the office with any questions or concerns that should arise related to todays visit.  No orders of the defined types were placed in this encounter.       I have personally obtained a history, examined the patient, evaluated laboratory and imaging results, formulated the assessment and plan and placed orders. This patient was seen today by Emmaline Kluver, PA-C in collaboration with Dr. Freda Munro.   Yevonne Pax, MD [facility] Diplomate ABMS Pulmonary Critical Care Medicine and Sleep Medicine

## 2022-08-31 ENCOUNTER — Ambulatory Visit (INDEPENDENT_AMBULATORY_CARE_PROVIDER_SITE_OTHER): Payer: Medicare HMO | Admitting: Internal Medicine

## 2022-08-31 VITALS — BP 116/67 | HR 99 | Resp 12 | Ht 73.0 in | Wt 238.5 lb

## 2022-08-31 DIAGNOSIS — Z7189 Other specified counseling: Secondary | ICD-10-CM | POA: Diagnosis not present

## 2022-08-31 DIAGNOSIS — G4733 Obstructive sleep apnea (adult) (pediatric): Secondary | ICD-10-CM

## 2022-08-31 NOTE — Patient Instructions (Signed)

## 2022-12-08 DIAGNOSIS — I4819 Other persistent atrial fibrillation: Secondary | ICD-10-CM | POA: Insufficient documentation

## 2022-12-15 ENCOUNTER — Ambulatory Visit
Admission: RE | Admit: 2022-12-15 | Discharge: 2022-12-15 | Disposition: A | Discharge status: Home / Self Care | Payer: Medicare HMO | Source: Ambulatory Visit | Attending: Cardiology | Admitting: Cardiology

## 2022-12-15 ENCOUNTER — Ambulatory Visit: Payer: Medicare HMO | Admitting: Anesthesiology

## 2022-12-15 ENCOUNTER — Encounter: Payer: Self-pay | Admitting: Cardiology

## 2022-12-15 ENCOUNTER — Ambulatory Visit
Admission: RE | Admit: 2022-12-15 | Discharge: 2022-12-15 | Disposition: A | Discharge status: Home / Self Care | Payer: Medicare HMO | Source: Ambulatory Visit | Attending: Student | Admitting: Cardiology

## 2022-12-15 ENCOUNTER — Encounter
Admission: RE | Disposition: A | Discharge status: Home / Self Care | Payer: Self-pay | Source: Ambulatory Visit | Attending: Cardiology

## 2022-12-15 ENCOUNTER — Other Ambulatory Visit: Payer: Self-pay

## 2022-12-15 DIAGNOSIS — I4891 Unspecified atrial fibrillation: Secondary | ICD-10-CM | POA: Insufficient documentation

## 2022-12-15 DIAGNOSIS — Z87891 Personal history of nicotine dependence: Secondary | ICD-10-CM | POA: Diagnosis not present

## 2022-12-15 DIAGNOSIS — G4730 Sleep apnea, unspecified: Secondary | ICD-10-CM | POA: Insufficient documentation

## 2022-12-15 DIAGNOSIS — E119 Type 2 diabetes mellitus without complications: Secondary | ICD-10-CM | POA: Diagnosis not present

## 2022-12-15 DIAGNOSIS — I4819 Other persistent atrial fibrillation: Secondary | ICD-10-CM

## 2022-12-15 HISTORY — PX: TEE WITHOUT CARDIOVERSION: SHX5443

## 2022-12-15 HISTORY — PX: CARDIOVERSION: SHX1299

## 2022-12-15 LAB — GLUCOSE, CAPILLARY: Glucose-Capillary: 147 mg/dL — ABNORMAL HIGH (ref 70–99)

## 2022-12-15 SURGERY — TRANSESOPHAGEAL ECHOCARDIOGRAM (TEE)
Anesthesia: General

## 2022-12-15 MED ORDER — PROPOFOL 10 MG/ML IV BOLUS
INTRAVENOUS | Status: DC | PRN
  Administered 2022-12-15: 20 mg via INTRAVENOUS
  Administered 2022-12-15: 50 mg via INTRAVENOUS
  Administered 2022-12-15: 30 mg via INTRAVENOUS
  Administered 2022-12-15 (×2): 25 mg via INTRAVENOUS

## 2022-12-15 MED ORDER — BUTAMBEN-TETRACAINE-BENZOCAINE 2-2-14 % EX AERO
INHALATION_SPRAY | CUTANEOUS | Status: AC
  Filled 2022-12-15: qty 5

## 2022-12-15 MED ORDER — SODIUM CHLORIDE 0.9 % IV BOLUS
500.0000 mL | Freq: Once | INTRAVENOUS | Status: AC
  Administered 2022-12-15: 500 mL via INTRAVENOUS

## 2022-12-15 MED ORDER — LIDOCAINE VISCOUS HCL 2 % MT SOLN
OROMUCOSAL | Status: AC
  Filled 2022-12-15: qty 15

## 2022-12-15 MED ORDER — PHENYLEPHRINE 80 MCG/ML (10ML) SYRINGE FOR IV PUSH (FOR BLOOD PRESSURE SUPPORT)
PREFILLED_SYRINGE | INTRAVENOUS | Status: AC
  Filled 2022-12-15: qty 10

## 2022-12-15 MED ORDER — PROPOFOL 1000 MG/100ML IV EMUL
INTRAVENOUS | Status: AC
  Filled 2022-12-15: qty 100

## 2022-12-15 MED ORDER — SODIUM CHLORIDE 0.9 % IV SOLN: INTRAVENOUS | Status: DC

## 2022-12-15 NOTE — Transfer of Care (Signed)
Immediate Anesthesia Transfer of Care Note  Patient: Steven Clements  Procedure(s) Performed: TRANSESOPHAGEAL ECHOCARDIOGRAM (TEE) CARDIOVERSION  Patient Location: PACU  Anesthesia Type:MAC  Level of Consciousness: drowsy  Airway & Oxygen Therapy: Patient Spontanous Breathing and Patient connected to nasal cannula oxygen  Post-op Assessment: Report given to RN and Post -op Vital signs reviewed and stable  Post vital signs: Reviewed and stable  Last Vitals:  Vitals Value Taken Time  BP 101/62 12/15/22 1239  Temp    Pulse 59 12/15/22 1242  Resp 18 12/15/22 1242  SpO2 97 % 12/15/22 1242    Last Pain:  Vitals:   12/15/22 1122  TempSrc: Oral  PainSc: 0-No pain         Complications: No notable events documented.

## 2022-12-15 NOTE — CV Procedure (Signed)
Electrical Cardioversion Procedure Note  Indication: Atrial Fibrillation  Procedure Details: Consent: Indication, Risk/benefits of procedure as well as the alternatives explained to patient and informed consent obtained. Time out performed. Verified patient identification, verified procedure, verified correct patient position, special equipment/implants available, medications/allergies/relevent history reviewed, required imaging and test results reviewed.  Deep sedation was provided by anesthesia with propofol. Patient was delivered with 200 Joules of electricity X 1 with success to Sinus rhythm. Patient tolerated the procedure well. No immediate complication noted.   Successful cardioversion  Steven Norfolk, MD [facility] Card[facility]- [facility]

## 2022-12-15 NOTE — Anesthesia Preprocedure Evaluation (Signed)
Anesthesia Evaluation  Patient identified by MRN, date of birth, ID band Patient awake    Reviewed: Allergy & Precautions, NPO status , Patient's Chart, lab work & pertinent test results  History of Anesthesia Complications Negative for: history of anesthetic complications  Airway Mallampati: III  TM Distance: >3 FB Neck ROM: Full    Dental no notable dental hx. (+) Teeth Intact, Dental Advidsory Given   Pulmonary neg shortness of breath, sleep apnea and Continuous Positive Airway Pressure Ventilation , neg COPD, neg recent URI, Patient abstained from smoking.Not current smoker, former smoker   Pulmonary exam normal breath sounds clear to auscultation       Cardiovascular Exercise Tolerance: Good METS(-) hypertension(-) angina (-) CAD, (-) Past MI and (-) Cardiac Stents + dysrhythmias Atrial Fibrillation  Rhythm:Regular Rate:Normal - Systolic murmurs    Neuro/Psych negative neurological ROS  negative psych ROS   GI/Hepatic ,neg GERD  ,,(+)     (-) substance abuse    Endo/Other  diabetes  Class 3 obesityDiabetes diagnosed after large dose steroid pack. Has not had issues or been on diabetes meds since then  Renal/GU negative Renal ROS     Musculoskeletal   Abdominal   Peds  Hematology   Anesthesia Other Findings Past Medical History: No date: Diabetes mellitus without complication (HCC) No date: History of kidney stones  Reproductive/Obstetrics                             Anesthesia Physical Anesthesia Plan  ASA: 2  Anesthesia Plan: General   Post-op Pain Management: Minimal or no pain anticipated   Induction: Intravenous  PONV Risk Score and Plan: 2 and Propofol infusion and TIVA  Airway Management Planned: Nasal Cannula and Natural Airway  Additional Equipment: None  Intra-op Plan:   Post-operative Plan:   Informed Consent: I have reviewed the patients History and  Physical, chart, labs and discussed the procedure including the risks, benefits and alternatives for the proposed anesthesia with the patient or authorized representative who has indicated his/her understanding and acceptance.     Dental advisory given  Plan Discussed with: CRNA and Surgeon  Anesthesia Plan Comments: (Discussed risks of anesthesia with patient, including possibility of difficulty with spontaneous ventilation under anesthesia necessitating airway intervention, PONV, and rare risks such as cardiac or respiratory or neurological events, and allergic reactions. Discussed the role of CRNA in patient's perioperative care. Patient understands.)       Anesthesia Quick Evaluation

## 2022-12-15 NOTE — Progress Notes (Signed)
*  PRELIMINARY RESULTS* Echocardiogram Echocardiogram Transesophageal has been performed.  [name] 12/15/2022, 1:19 PM

## 2022-12-16 ENCOUNTER — Encounter: Payer: Self-pay | Admitting: Cardiology

## 2022-12-16 LAB — ECHO TEE

## 2022-12-21 NOTE — Anesthesia Postprocedure Evaluation (Signed)
Anesthesia Post Note  Patient: Steven Clements  Procedure(s) Performed: TRANSESOPHAGEAL ECHOCARDIOGRAM (TEE) CARDIOVERSION  Patient location during evaluation: Specials Recovery Anesthesia Type: General Level of consciousness: awake and alert Pain management: pain level controlled Vital Signs Assessment: post-procedure vital signs reviewed and stable Respiratory status: spontaneous breathing, nonlabored ventilation, respiratory function stable and patient connected to nasal cannula oxygen Cardiovascular status: blood pressure returned to baseline and stable Postop Assessment: no apparent nausea or vomiting Anesthetic complications: no   No notable events documented.   Last Vitals:  Vitals:   12/15/22 1345 12/15/22 1400  BP: 96/69 98/61  Pulse: 66   Resp: 18 16  Temp:    SpO2: 99% 98%    Last Pain:  Vitals:   12/15/22 1400  TempSrc:   PainSc: 0-No pain                 Lenard Simmer

## 2023-01-16 ENCOUNTER — Emergency Department (HOSPITAL_BASED_OUTPATIENT_CLINIC_OR_DEPARTMENT_OTHER)
Admission: EM | Admit: 2023-01-16 | Discharge: 2023-01-16 | Disposition: A | Discharge status: Home / Self Care | Payer: Medicare HMO | Attending: Emergency Medicine | Admitting: Emergency Medicine

## 2023-01-16 ENCOUNTER — Other Ambulatory Visit: Payer: Self-pay

## 2023-01-16 ENCOUNTER — Emergency Department (HOSPITAL_BASED_OUTPATIENT_CLINIC_OR_DEPARTMENT_OTHER): Payer: Medicare HMO

## 2023-01-16 DIAGNOSIS — E119 Type 2 diabetes mellitus without complications: Secondary | ICD-10-CM | POA: Insufficient documentation

## 2023-01-16 DIAGNOSIS — R319 Hematuria, unspecified: Secondary | ICD-10-CM | POA: Diagnosis present

## 2023-01-16 DIAGNOSIS — Z7901 Long term (current) use of anticoagulants: Secondary | ICD-10-CM | POA: Diagnosis not present

## 2023-01-16 DIAGNOSIS — N200 Calculus of kidney: Secondary | ICD-10-CM | POA: Diagnosis not present

## 2023-01-16 LAB — URINALYSIS, ROUTINE W REFLEX MICROSCOPIC
Bilirubin Urine: NEGATIVE
Glucose, UA: NEGATIVE mg/dL
Ketones, ur: NEGATIVE mg/dL
Leukocytes,Ua: NEGATIVE
Nitrite: NEGATIVE
Protein, ur: NEGATIVE mg/dL
Specific Gravity, Urine: <=1.005 (ref 1.005–1.030)
pH: 6 (ref 5.0–8.0)

## 2023-01-16 LAB — URINALYSIS, MICROSCOPIC (REFLEX)
Bacteria, UA: NONE SEEN
Squamous Epithelial / HPF: NONE SEEN /[HPF] (ref 0–5)
WBC, UA: NONE SEEN WBC/hpf (ref 0–5)

## 2023-01-16 MED ORDER — TAMSULOSIN HCL 0.4 MG PO CAPS: 0.4000 mg | ORAL_CAPSULE | Freq: Every day | ORAL | 0 refills | Status: AC

## 2023-01-16 MED ORDER — TAMSULOSIN HCL 0.4 MG PO CAPS
0.4000 mg | ORAL_CAPSULE | Freq: Once | ORAL | Status: AC
  Administered 2023-01-16: 0.4 mg via ORAL
  Filled 2023-01-16: qty 1

## 2023-01-16 NOTE — ED Notes (Signed)
 Discharge paperwork reviewed entirely with patient, including follow up care. Pain was under control. The patient received instruction and coaching on their prescriptions, and all follow-up questions were answered.  Pt verbalized understanding as well as all parties involved. No questions or concerns voiced at the time of discharge. No acute distress noted.   Pt ambulated out to PVA without incident or assistance.  Pt advised they will notify their PCP immediately. and Pt advised they will seek followup care with a specialist and followup with their PCP.

## 2023-01-16 NOTE — ED Provider Notes (Cosign Needed)
[facility] Provider Note   CSN: [id number] Arrival date & time: 01/16/23  1508     History  Chief Complaint  Patient presents with   Hematuria    Steven Clements is a 67 y.o. male with history of nephrolithiasis, diabetes, presents with concern for blood in his urine he noticed earlier today.  Reports he passed a kidney stone a couple days ago.  He has continued to have some right flank pain which he feels is likely due to other stones.  States he is very prone to kidney stones, but has never visited a urologist regarding this.  Denies any fever or chills at home.  No nausea or vomiting.   Hematuria       Home Medications Prior to Admission medications   Medication Sig Start Date End Date Taking? Authorizing Provider  tamsulosin (FLOMAX) 0.4 MG CAPS capsule Take 1 capsule (0.4 mg total) by mouth daily. 01/16/23  Yes Arabella Merles, PA-C  albuterol (VENTOLIN HFA) 108 (90 Base) MCG/ACT inhaler Inhale 1-2 puffs into the lungs every 6 (six) hours as needed for wheezing or shortness of breath. 03/27/21   Shirlee Latch, PA-C  apixaban (ELIQUIS) 5 MG TABS tablet Take 5 mg by mouth 2 (two) times daily.    [provider]  budesonide-formoterol (SYMBICORT) 80-4.5 MCG/ACT inhaler Inhale 2 puffs into the lungs 2 (two) times daily as needed (asthma). 03/22/19   [provider]  metoprolol tartrate (LOPRESSOR) 25 MG tablet Take 25 mg by mouth 2 (two) times daily.    [provider]  sildenafil (VIAGRA) 50 MG tablet Take 50 mg by mouth as needed for erectile dysfunction. 03/17/21   [provider]  fexofenadine (ALLEGRA) 180 MG tablet Take 1 tablet (180 mg total) by mouth daily. 01/29/16 03/24/21  Multani, Bhupinder, MD  fluticasone (FLONASE) 50 MCG/ACT nasal spray Place 1 spray into both nostrils daily. 01/29/16 03/24/21  Reinaldo Raddle, MD      Allergies    Prednisone, Oxycodone, and Statins     Review of Systems   Review of Systems  Genitourinary:  Positive for hematuria.    Physical Exam Updated Vital Signs BP 115/79 (BP Location: Left Arm)   Pulse 75   Temp 98.5 F (36.9 C) (Oral)   Resp 18   Ht 6\' 1"  (1.854 m)   Wt 108 kg   SpO2 100%   BMI 31.40 kg/m  Physical Exam Vitals and nursing note reviewed.  Constitutional:      Appearance: Normal appearance.     Comments: Well-appearing, no active vomiting Able to move around comfortably  HENT:     Head: Atraumatic.  Cardiovascular:     Rate and Rhythm: Normal rate and regular rhythm.  Pulmonary:     Effort: Pulmonary effort is normal.  Abdominal:     General: Abdomen is flat.     Palpations: Abdomen is soft.     Tenderness: There is no right CVA tenderness or left CVA tenderness.  Neurological:     General: No focal deficit present.     Mental Status: He is alert.  Psychiatric:        Mood and Affect: Mood normal.        Behavior: Behavior normal.     ED Results / Procedures / Treatments   Labs (all labs ordered are listed, but only abnormal results are displayed) Labs Reviewed  URINALYSIS, ROUTINE W REFLEX MICROSCOPIC - Abnormal; Notable for the following  components:      Result Value   Hgb urine dipstick LARGE (*)    All other components within normal limits  URINALYSIS, MICROSCOPIC (REFLEX)    EKG None  Radiology CT Renal Stone Study Result Date: 01/16/2023 CLINICAL DATA:  History of kidney stones with recent passage and hematuria, initial encounter EXAM: CT ABDOMEN AND PELVIS WITHOUT CONTRAST TECHNIQUE: Multidetector CT imaging of the abdomen and pelvis was performed following the standard protocol without IV contrast. RADIATION DOSE REDUCTION: This exam was performed according to the departmental dose-optimization program which includes automated exposure control, adjustment of the mA and/or kV according to patient size and/or use of iterative reconstruction technique. COMPARISON:  05/26/2012  FINDINGS: Lower chest: No acute abnormality. Hepatobiliary: No focal liver abnormality is seen. No gallstones, gallbladder wall thickening, or biliary dilatation. Pancreas: Unremarkable. No pancreatic ductal dilatation or surrounding inflammatory changes. Spleen: Normal in size without focal abnormality. Adrenals/Urinary Tract: Adrenal glands are within normal limits. Kidneys demonstrate nonobstructing renal calculi bilaterally. These are greater in size and number on the left. The largest of these on the left measures approximately 15 mm in the upper pole. No obstructive changes are seen although multiple distal right ureteral stones are noted just above the UVJ. The largest of these measures approximately 10 mm. The bladder is decompressed. Stomach/Bowel: Scattered diverticular change of the colon is noted without evidence of diverticulitis. No obstructive changes are seen. The appendix is within normal limits. Small bowel and stomach are unremarkable. Vascular/Lymphatic: Aortic atherosclerosis. No enlarged abdominal or pelvic lymph nodes. Reproductive: Prostate is unremarkable. Other: No abdominal wall hernia or abnormality. No abdominopelvic ascites. Musculoskeletal: No acute or significant osseous findings. IMPRESSION: Distal right ureteral stones as described without significant obstructive change. Bilateral nonobstructing calculi worse on the left than the right. Diverticulosis without diverticulitis. Electronically Signed   By: Alcide Clever M.D.   On: 01/16/2023 19:56    Procedures Procedures    Medications Ordered in ED Medications  tamsulosin (FLOMAX) capsule 0.4 mg (0.4 mg Oral Given 01/16/23 2117)    ED Course/ Medical Decision Making/ A&P                                 Medical Decision Making Amount and/or Complexity of Data Reviewed Labs: ordered. Radiology: ordered.  Risk Prescription drug management.     Differential diagnosis includes but is not limited to  nephrolithiasis, cystitis, pyelonephritis, malignancy  ED Course:  Patient well-appearing, stable vital signs.  Moving around comfortably.  States he passed a stone a couple days ago, and is having right-sided flank pain that seems consistent with prior stones.  No fever or chills at home.   His urinalysis does reveal large amount of hemoglobin, no leukocytes, white blood cells, nitrates, no concern for infection at this time.  He reports that the urine sample given today in the ER was yellow appearing and does not reflect the blood he saw earlier today which he could visually see.  CT renal stone study was obtained which showed distal right ureteral stones that are non-obstructing, with one stone measuring up to 10 mm.  Patient also has many nonobstructing calculi in the kidneys bilaterally. I discussed with patient the results of the CT scan including that he has a 10 mm stone in the ureter.  We discussed that given the size these can be very hard to pass.  He states that he has had a 8 mm  stone before and has passed it without difficulty.  He is not in any significant pain and is managing at home with occasional ibuprofen as needed.  No nausea or vomiting.  He would like to manage this at home.  He was given first dose of Flomax here today. We discussed that he needs to follow-up with urology for further management of his stones and further evaluation of his hematuria.  He is in understanding and is agreement with this plan.  Patient stable and appropriate for discharge home at this time.  Impression: Right-sided nephrolithiasis  Disposition:  The patient was discharged home with instructions to take Flomax daily until stone is passed.  Tylenol and ibuprofen as needed for pain.  I offered oxycodone for breakthrough pain, patient declines any further pain medication at this time.  Follow-up with urology within the next week.  Urologist contact information was provided. Return precautions  given.             Final Clinical Impression(s) / ED Diagnoses Final diagnoses:  Hematuria, unspecified type  Nephrolithiasis    Rx / DC Orders ED Discharge Orders          Ordered    tamsulosin (FLOMAX) 0.4 MG CAPS capsule  Daily        01/16/23 2101              Arabella Merles, [place] 01/16/23 2354

## 2023-01-16 NOTE — Discharge Instructions (Addendum)
The CT scan today showed that you have stones in both kidneys which are nonobstructing.  You also have a couple stones in the right ureter (tube that connects the kidney to the bladder).  The largest of the stones measures 10 mm.   We are sending you home with multiple medications to assist with passing the stone:   -Flomax-this is a medication to help pass the stone, it allows urine to exit the body more freely.  Please take this once daily with a meal until you pass your stones.  -Ibuprofen 800 mg-this is a medication that will help with pain as well as passing the stone.  Please take this every 8 hours.  Take this with food as it can cause stomach upset and at worst stomach bleeding.  Do not take other NSAIDs such as Motrin, Aleve, Advil, Mobic, or Naproxen with this medicine as they are similar and would propagate any potential side effects.   Please follow-up with the urology group provided in your discharge instructions within 3 to 5 days.    Return to the ER for new or worsening symptoms including but not limited to worsening pain not controlled by these medicines, inability to keep fluids down, fever, or any other concerns that you may have.

## 2023-01-16 NOTE — ED Triage Notes (Signed)
Patient reports having had multiple kidney stones. Passed one a few days ago.  Noticed blood yesterday. Is on blood thinners.

## 2023-08-02 NOTE — Progress Notes (Signed)
 [facility] [address] [place], [place] [address]  Pulmonary Sleep Medicine   Office Visit Note  Patient Name: Steven Clements DOB: 10-Feb-1955 MRN [id number]    Chief Complaint: Obstructive Sleep Apnea visit  Brief History:  Steven Clements is seen today for an annual follow up visit for CPAP@ 8 cmH2O. The patient has a 15 year history of sleep apnea. Patient is using PAP nightly.  The patient feels rested after sleeping with PAP.  The patient reports benefiting from PAP use. Reported sleepiness is  improved and the Epworth Sleepiness Score is 3 out of 24. The patient does not take naps. The patient complains of the following: pt is in need of new supplies.  The compliance download shows 99% compliance with an average use time of 7 hours 35 minutes. The AHI is 0.2.  The patient does not complain of limb movements disrupting sleep. The patient continues to require PAP therapy in order to eliminate sleep apnea.   ROS  General: (-) fever, (-) chills, (-) night sweat Nose and Sinuses: (-) nasal stuffiness or itchiness, (-) postnasal drip, (-) nosebleeds, (-) sinus trouble. Mouth and Throat: (-) sore throat, (-) hoarseness. Neck: (-) swollen glands, (-) enlarged thyroid, (-) neck pain. Respiratory: - cough, - shortness of breath, - wheezing. Neurologic: - numbness, - tingling. Psychiatric: - anxiety, - depression   Current Medication: Outpatient Encounter Medications as of 08/03/2023  Medication Sig   albuterol  (VENTOLIN  HFA) 108 (90 Base) MCG/ACT inhaler Inhale 1-2 puffs into the lungs every 6 (six) hours as needed for wheezing or shortness of breath.   apixaban (ELIQUIS) 5 MG TABS tablet Take 5 mg by mouth 2 (two) times daily.   budesonide-formoterol (SYMBICORT) 80-4.5 MCG/ACT inhaler Inhale 2 puffs into the lungs 2 (two) times daily as needed (asthma).   sildenafil (VIAGRA) 50 MG tablet Take 50 mg by mouth as needed for erectile dysfunction.   tamsulosin  (FLOMAX )  0.4 MG CAPS capsule Take 1 capsule (0.4 mg total) by mouth daily.   [DISCONTINUED] fexofenadine  (ALLEGRA ) 180 MG tablet Take 1 tablet (180 mg total) by mouth daily.   [DISCONTINUED] fluticasone  (FLONASE ) 50 MCG/ACT nasal spray Place 1 spray into both nostrils daily.   [DISCONTINUED] metoprolol tartrate (LOPRESSOR) 25 MG tablet Take 25 mg by mouth 2 (two) times daily.   No facility-administered encounter medications on file as of 08/03/2023.    Surgical History: Past Surgical History:  Procedure Laterality Date   CARDIOVERSION N/A 12/15/2022   Procedure: CARDIOVERSION;  Surgeon: Wilburn Keller BROCKS, MD;  Location: [facility] ORS;  Service: Cardiovascular;  Laterality: N/A;   COLONOSCOPY WITH PROPOFOL  N/A 04/13/2022   Procedure: COLONOSCOPY WITH PROPOFOL ;  Surgeon: Maryruth Ole DASEN, MD;  Location: [facility] ENDOSCOPY;  Service: Endoscopy;  Laterality: N/A;   LITHOTRIPSY     TEE WITHOUT CARDIOVERSION N/A 12/15/2022   Procedure: TRANSESOPHAGEAL ECHOCARDIOGRAM (TEE);  Surgeon: Alluri, Keller BROCKS, MD;  Location: [facility] ORS;  Service: Cardiovascular;  Laterality: N/A;    Medical History: Past Medical History:  Diagnosis Date   Diabetes mellitus without complication (HCC)    History of kidney stones     Family History: Non contributory to the present illness  Social History: Social History   Socioeconomic History   Marital status: Married    Spouse name: Not on file   Number of children: Not on file   Years of education: Not on file   Highest education level: Not on file  Occupational History   Not on file  Tobacco  Use   Smoking status: Former    Current packs/day: 0.00    Types: Cigarettes    Quit date: 03/06/1999    Years since quitting: 24.4   Smokeless tobacco: Current  Vaping Use   Vaping status: Never Used  Substance and Sexual Activity   Alcohol use: Yes   Drug use: Never   Sexual activity: Not on file  Other Topics Concern   Not on file  Social History Narrative   Not on file    Social Drivers of Health   Financial Resource Strain: Low Risk  (11/29/2022)   Received from [facility]   Overall Financial Resource Strain (CARDIA)    Difficulty of Paying Living Expenses: Not very hard  Food Insecurity: No Food Insecurity (11/29/2022)   Received from [facility]   Hunger Vital Sign    Within the past 12 months, you worried that your food would run out before you got the money to buy more.: Never true    Within the past 12 months, the food you bought just didn't last and you didn't have money to get more.: Never true  Transportation Needs: No Transportation Needs (11/29/2022)   Received from [facility] - Transportation    In the past 12 months, has lack of transportation kept you from medical appointments or from getting medications?: No    Lack of Transportation (Non-Medical): No  Physical Activity: Not on file  Stress: Not on file  Social Connections: Not on file  Intimate Partner Violence: Not on file    Vital Signs: Blood pressure 121/71, pulse 71, resp. rate 16, height 6' 1 (1.854 m), weight 220 lb (99.8 kg), SpO2 98%. Body mass index is 29.03 kg/m.    Examination: General Appearance: The patient is well-developed, well-nourished, and in no distress. Neck Circumference: 46 cm Skin: Gross inspection of skin unremarkable. Head: normocephalic, no gross deformities. Eyes: no gross deformities noted. ENT: ears appear grossly normal Neurologic: Alert and oriented. No involuntary movements.  STOP BANG RISK ASSESSMENT S (snore) Have you been told that you snore?     NO   T (tired) Are you often tired, fatigued, or sleepy during the day?   NO  O (obstruction) Do you stop breathing, choke, or gasp during sleep? NO   P (pressure) Do you have or are you being treated for high blood pressure? NO   B (BMI) Is your body index greater than 35 kg/m? NO   A (age) Are you 39 years old or older?  YES   N (neck) Do you have a neck circumference greater than 16 inches?   YES   G (gender) Are you a male? YES   TOTAL STOP/BANG "YES" ANSWERS 3       A STOP-Bang score of 2 or less is considered low risk, and a score of 5 or more is high risk for having either moderate or severe OSA. For people who score 3 or 4, doctors may need to perform further assessment to determine how likely they are to have OSA.         EPWORTH SLEEPINESS SCALE:  Scale:  (0)= no chance of dozing; (1)= slight chance of dozing; (2)= moderate chance of dozing; (3)= high chance of dozing  Chance  Situtation    Sitting and reading: 1    Watching TV: 0    Sitting Inactive in public: 0    As a passenger in car: 1  Lying down to rest: 0    Sitting and talking: 0    Sitting quielty after lunch: 1    In a car, stopped in traffic: 0   TOTAL SCORE:   3 out of 24    SLEEP STUDIES:  PSG - 02/2008 - AHI 55/hr, min Sp02 74% Titration - 02/2008 - CPAP @ 8cmH20  CPAP COMPLIANCE DATA:  Date Range: 07/31/2022-07/30/2023  Average Daily Use: 7 hours 35 minutes  Median Use: 7 hours 36 minutes  Compliance for > 4 Hours: 99%  AHI: 0.2 respiratory events per hour  Days Used: 364/365 days  Mask Leak: 20.8  95th Percentile Pressure: 8         LABS: No results found for this or any previous visit (from the past 2160 hours).  Radiology: CT Renal Stone Study Result Date: 01/16/2023 CLINICAL DATA:  History of kidney stones with recent passage and hematuria, initial encounter EXAM: CT ABDOMEN AND PELVIS WITHOUT CONTRAST TECHNIQUE: Multidetector CT imaging of the abdomen and pelvis was performed following the standard protocol without IV contrast. RADIATION DOSE REDUCTION: This exam was performed according to the departmental dose-optimization program which includes automated exposure control, adjustment of the mA and/or kV according to patient size and/or use of iterative reconstruction  technique. COMPARISON:  05/26/2012 FINDINGS: Lower chest: No acute abnormality. Hepatobiliary: No focal liver abnormality is seen. No gallstones, gallbladder wall thickening, or biliary dilatation. Pancreas: Unremarkable. No pancreatic ductal dilatation or surrounding inflammatory changes. Spleen: Normal in size without focal abnormality. Adrenals/Urinary Tract: Adrenal glands are within normal limits. Kidneys demonstrate nonobstructing renal calculi bilaterally. These are greater in size and number on the left. The largest of these on the left measures approximately 15 mm in the upper pole. No obstructive changes are seen although multiple distal right ureteral stones are noted just above the UVJ. The largest of these measures approximately 10 mm. The bladder is decompressed. Stomach/Bowel: Scattered diverticular change of the colon is noted without evidence of diverticulitis. No obstructive changes are seen. The appendix is within normal limits. Small bowel and stomach are unremarkable. Vascular/Lymphatic: Aortic atherosclerosis. No enlarged abdominal or pelvic lymph nodes. Reproductive: Prostate is unremarkable. Other: No abdominal wall hernia or abnormality. No abdominopelvic ascites. Musculoskeletal: No acute or significant osseous findings. IMPRESSION: Distal right ureteral stones as described without significant obstructive change. Bilateral nonobstructing calculi worse on the left than the right. Diverticulosis without diverticulitis. Electronically Signed   By: Oneil Devonshire M.D.   On: 01/16/2023 19:56    No results found.  No results found.    Assessment and Plan: Patient Active Problem List   Diagnosis Date Noted   Persistent atrial fibrillation (HCC) 12/08/2022   OSA on CPAP 03/24/2021   CPAP use counseling 03/24/2021   Obesity 03/24/2021   Diabetes mellitus without complication (HCC) 03/24/2021      The patient does tolerate PAP and reports benefit from PAP use. The patient was  reminded how to adjust mask fit and advised to change supplies regularly. The patient was also counselled on nightly use. The compliance is excellent. The AHI is 0.2. Patient continues to require PAP to treat their apnea and is medically necessary.   1. OSA on CPAP (Primary) Continue excellent compliance  2. CPAP use counseling CPAP couseling-Discussed importance of adequate CPAP use as well as proper care and cleaning techniques of machine and all supplies.  3. Persistent atrial fibrillation (HCC) Followed by cardiology   General Counseling: I have discussed the findings of the evaluation  and examination with [place].  I have also discussed any further diagnostic evaluation thatmay be needed or ordered today. Everitt verbalizes understanding of the findings of todays visit. We also reviewed his medications today and discussed drug interactions and side effects including but not limited excessive drowsiness and altered mental states. We also discussed that there is always a risk not just to him but also people around him. he has been encouraged to call the office with any questions or concerns that should arise related to todays visit.  No orders of the defined types were placed in this encounter.       I have personally obtained a history, examined the patient, evaluated laboratory and imaging results, formulated the assessment and plan and placed orders.  This patient was seen by Tinnie Pro, PA-C in collaboration with Dr. Elfreda Bathe as a part of collaborative care agreement.  Elfreda DELENA Bathe, MD [facility] Diplomate ABMS Pulmonary Critical Care Medicine and Sleep Medicine

## 2023-08-03 ENCOUNTER — Ambulatory Visit (INDEPENDENT_AMBULATORY_CARE_PROVIDER_SITE_OTHER): Admitting: Internal Medicine

## 2023-08-03 VITALS — BP 121/71 | HR 71 | Resp 16 | Ht 73.0 in | Wt 220.0 lb

## 2023-08-03 DIAGNOSIS — I4819 Other persistent atrial fibrillation: Secondary | ICD-10-CM

## 2023-08-03 DIAGNOSIS — G4733 Obstructive sleep apnea (adult) (pediatric): Secondary | ICD-10-CM

## 2023-08-03 DIAGNOSIS — Z7189 Other specified counseling: Secondary | ICD-10-CM

## 2023-08-03 NOTE — Patient Instructions (Signed)
